# Patient Record
Sex: Female | Born: 1975 | Race: White | Hispanic: No | Marital: Married | Smoking: Former smoker
Health system: Southern US, Community
[De-identification: ages and names within clinical notes are randomized; demographics above are authoritative.]

## PROBLEM LIST (undated history)

## (undated) ENCOUNTER — Inpatient Hospital Stay (HOSPITAL_COMMUNITY): Payer: Self-pay

## (undated) DIAGNOSIS — D649 Anemia, unspecified: Secondary | ICD-10-CM

## (undated) DIAGNOSIS — R51 Headache: Secondary | ICD-10-CM

## (undated) DIAGNOSIS — G43909 Migraine, unspecified, not intractable, without status migrainosus: Secondary | ICD-10-CM

## (undated) DIAGNOSIS — F329 Major depressive disorder, single episode, unspecified: Secondary | ICD-10-CM

## (undated) DIAGNOSIS — J45909 Unspecified asthma, uncomplicated: Secondary | ICD-10-CM

## (undated) DIAGNOSIS — Z8619 Personal history of other infectious and parasitic diseases: Secondary | ICD-10-CM

## (undated) DIAGNOSIS — R519 Headache, unspecified: Secondary | ICD-10-CM

## (undated) DIAGNOSIS — F32A Depression, unspecified: Secondary | ICD-10-CM

## (undated) HISTORY — DX: Depression, unspecified: F32.A

## (undated) HISTORY — DX: Unspecified asthma, uncomplicated: J45.909

## (undated) HISTORY — DX: Anemia, unspecified: D64.9

## (undated) HISTORY — DX: Major depressive disorder, single episode, unspecified: F32.9

## (undated) HISTORY — PX: WISDOM TOOTH EXTRACTION: SHX21

## (undated) HISTORY — DX: Personal history of other infectious and parasitic diseases: Z86.19

---

## 2000-03-11 ENCOUNTER — Other Ambulatory Visit: Admission: RE | Admit: 2000-03-11 | Discharge: 2000-03-11 | Payer: Self-pay | Admitting: Gynecology

## 2003-03-30 ENCOUNTER — Encounter: Payer: Self-pay | Admitting: Emergency Medicine

## 2003-03-30 ENCOUNTER — Emergency Department (HOSPITAL_COMMUNITY): Admission: EM | Admit: 2003-03-30 | Discharge: 2003-03-30 | Payer: Self-pay | Admitting: Emergency Medicine

## 2003-04-01 ENCOUNTER — Inpatient Hospital Stay (HOSPITAL_COMMUNITY): Admission: AD | Admit: 2003-04-01 | Discharge: 2003-04-01 | Payer: Self-pay | Admitting: Obstetrics & Gynecology

## 2004-02-02 ENCOUNTER — Emergency Department (HOSPITAL_COMMUNITY): Admission: EM | Admit: 2004-02-02 | Discharge: 2004-02-02 | Payer: Self-pay | Admitting: *Deleted

## 2004-07-19 ENCOUNTER — Ambulatory Visit (HOSPITAL_COMMUNITY): Admission: RE | Admit: 2004-07-19 | Discharge: 2004-07-19 | Payer: Self-pay | Admitting: Emergency Medicine

## 2005-07-28 ENCOUNTER — Other Ambulatory Visit: Admission: RE | Admit: 2005-07-28 | Discharge: 2005-07-28 | Payer: Self-pay | Admitting: Obstetrics and Gynecology

## 2006-02-12 ENCOUNTER — Inpatient Hospital Stay (HOSPITAL_COMMUNITY): Admission: AD | Admit: 2006-02-12 | Discharge: 2006-02-14 | Payer: Self-pay | Admitting: Obstetrics and Gynecology

## 2010-12-11 ENCOUNTER — Other Ambulatory Visit: Payer: Self-pay | Admitting: Internal Medicine

## 2010-12-11 DIAGNOSIS — N644 Mastodynia: Secondary | ICD-10-CM

## 2010-12-16 ENCOUNTER — Ambulatory Visit
Admission: RE | Admit: 2010-12-16 | Discharge: 2010-12-16 | Disposition: A | Discharge status: Home / Self Care | Payer: BC Managed Care – PPO | Source: Ambulatory Visit | Attending: Internal Medicine | Admitting: Internal Medicine

## 2010-12-16 DIAGNOSIS — N644 Mastodynia: Secondary | ICD-10-CM

## 2013-04-05 ENCOUNTER — Encounter: Payer: Self-pay | Admitting: Obstetrics & Gynecology

## 2013-04-05 ENCOUNTER — Ambulatory Visit (INDEPENDENT_AMBULATORY_CARE_PROVIDER_SITE_OTHER): Payer: BC Managed Care – PPO | Admitting: Obstetrics & Gynecology

## 2013-04-05 VITALS — BP 120/80 | HR 60 | Resp 16 | Ht 65.0 in | Wt 172.8 lb

## 2013-04-05 DIAGNOSIS — R5381 Other malaise: Secondary | ICD-10-CM

## 2013-04-05 DIAGNOSIS — Z124 Encounter for screening for malignant neoplasm of cervix: Secondary | ICD-10-CM

## 2013-04-05 DIAGNOSIS — Z0000 Encounter for general adult medical examination without abnormal findings: Secondary | ICD-10-CM

## 2013-04-05 DIAGNOSIS — Z01419 Encounter for gynecological examination (general) (routine) without abnormal findings: Secondary | ICD-10-CM

## 2013-04-05 LAB — POCT URINALYSIS DIPSTICK
Glucose, UA: NEGATIVE
pH, UA: 5

## 2013-04-05 MED ORDER — DIAPHRAGM ARC-SPRING 65 MM VA DPRH: 1.0000 [IU] | VAGINAL_INSERT | Freq: Once | VAGINAL | Status: DC

## 2013-04-05 NOTE — Patient Instructions (Signed)

## 2013-04-05 NOTE — Progress Notes (Addendum)
37 y.o. G3P2 MarriedCaucasianF here for annual exam.  Friend of Luvenia Redden.  Changing from Ob office.  Cycles regular.  Did have a Mirena IUD for about six years.  Removed June, 2014.  Has had headaches with pills.  "Feels better" without mirena.    Patient's last menstrual period was 03/18/2013.          Sexually active: yes  The current method of family planning is condoms .    Exercising: yes  some walking and aerobics Smoker:  no  Health Maintenance: Pap:  2/13 normal History of abnormal Pap:  no MMG:  2012 for breast pain-normal Colonoscopy:  none BMD:   none TDaP:  Thinks with last pregnancy in 2007 Screening Labs: will start at age 5, Urine today: WBC-trace, RBC-+1   reports that she has never smoked. She has never used smokeless tobacco. She reports that  drinks alcohol. She reports that she does not use illicit drugs.  Past Medical History  Diagnosis Date  . Anemia     during pregnancy    Past Surgical History  Procedure Laterality Date  . Cesarean section      emergency  . Wisdom tooth extraction      No current outpatient prescriptions on file.   No current facility-administered medications for this visit.    Family History  Problem Relation Age of Onset  . Hypertension Mother   . Diabetes Mother   . Hyperlipidemia Mother   . Cancer Maternal Grandmother     lung  . Cancer Maternal Grandfather     unknown type  . Cancer Paternal Grandfather     lung  .       ROS:  Pertinent items are noted in HPI.  Otherwise, a comprehensive ROS was negative.  Exam:   BP 120/80  Pulse 60  Resp 16  Ht 5\' 5"  (1.651 m)  Wt 172 lb 12.8 oz (78.382 kg)  BMI 28.76 kg/m2  LMP 03/18/2013  Height: 5\' 5"  (165.1 cm)  Ht Readings from Last 3 Encounters:  04/05/13 5\' 5"  (1.651 m)    General appearance: alert, cooperative and appears stated age Head: Normocephalic, without obvious abnormality, atraumatic Neck: no adenopathy, supple, symmetrical, trachea midline and  thyroid normal to inspection and palpation Lungs: clear to auscultation bilaterally Breasts: normal appearance, no masses or tenderness Heart: regular rate and rhythm Abdomen: soft, non-tender; bowel sounds normal; no masses,  no organomegaly Extremities: extremities normal, atraumatic, no cyanosis or edema Skin: Skin color, texture, turgor normal. No rashes or lesions Lymph nodes: Cervical, supraclavicular, and axillary nodes normal. No abnormal inguinal nodes palpated Neurologic: Grossly normal   Pelvic: External genitalia:  no lesions              Urethra:  normal appearing urethra with no masses, tenderness or lesions              Bartholins and Skenes: normal                 Vagina: normal appearing vagina with normal color and discharge, no lesions              Cervix: no lesions              Pap taken: yes Bimanual Exam:  Uterus:  normal size, contour, position, consistency, mobility, non-tender              Adnexa: normal adnexa and no mass, fullness, tenderness  Rectovaginal: Confirms               Anus:  normal sphincter tone, no lesions  A:  Well Woman with normal exam Using condoms for [facility].  Discusseed other options--BTL, Essure, Paraguard IUD, diaphragm.  Pt will try diaphragm.  P:   Mammogram starting age 35 pap smear with HR HPV today RX for 65mm diaphram return annually or prn  An After Visit Summary was printed and given to the patient.

## 2013-04-07 LAB — IPS PAP TEST WITH HPV

## 2013-04-08 ENCOUNTER — Telehealth: Payer: Self-pay

## 2013-04-08 NOTE — Telephone Encounter (Signed)
Message copied by Elisha Headland on Fri Apr 08, 2013  4:24 PM ------      Message from: Jerene Bears      Created: Fri Apr 08, 2013  5:33 AM       Inform Pap nl and HPV neg but Pap showed yeast.  If having any symptoms, ok to prescribe Diflucan 150mg  po x 1, repeat 48 hrs.  #2/0RF ------

## 2013-04-08 NOTE — Telephone Encounter (Signed)
Lmtcb//kn 

## 2013-04-19 ENCOUNTER — Other Ambulatory Visit (INDEPENDENT_AMBULATORY_CARE_PROVIDER_SITE_OTHER): Payer: BC Managed Care – PPO

## 2013-04-19 DIAGNOSIS — Z0000 Encounter for general adult medical examination without abnormal findings: Secondary | ICD-10-CM

## 2013-04-19 DIAGNOSIS — R5381 Other malaise: Secondary | ICD-10-CM

## 2013-04-19 DIAGNOSIS — Z01419 Encounter for gynecological examination (general) (routine) without abnormal findings: Secondary | ICD-10-CM

## 2013-04-19 DIAGNOSIS — Z124 Encounter for screening for malignant neoplasm of cervix: Secondary | ICD-10-CM

## 2013-04-19 NOTE — Addendum Note (Signed)
Addended by: Jerene Bears on: 04/19/2013 09:42 PM   Modules accepted: Orders

## 2013-04-20 LAB — COMPLETE METABOLIC PANEL WITH GFR
ALT: 14 U/L (ref 0–35)
AST: 15 U/L (ref 0–37)
Albumin: 4.4 g/dL (ref 3.5–5.2)
Alkaline Phosphatase: 35 U/L — ABNORMAL LOW (ref 39–117)
BUN: 16 mg/dL (ref 6–23)
Calcium: 9.8 mg/dL (ref 8.4–10.5)
Chloride: 101 mEq/L (ref 96–112)
GFR, Est Non African American: >89 mL/min
Glucose, Bld: 90 mg/dL (ref 70–99)
Potassium: 4.7 mEq/L (ref 3.5–5.3)

## 2013-04-20 LAB — CBC
HCT: 45 % (ref 36.0–46.0)
Hemoglobin: 15.1 g/dL — ABNORMAL HIGH (ref 12.0–15.0)
RDW: 13.8 % (ref 11.5–15.5)
WBC: 6.3 10*3/uL (ref 4.0–10.5)

## 2013-04-20 LAB — TSH: TSH: 0.819 u[IU]/mL (ref 0.350–4.500)

## 2013-04-20 NOTE — Addendum Note (Signed)
Addended by: Joeseph Amor on: 04/20/2013 09:57 AM   Modules accepted: Orders

## 2013-04-20 NOTE — Telephone Encounter (Signed)
Advised patient labs are in progress and would be contacted when they are resulted. Patient states she continues to have fatigue, joint ache, sob. Talking in full sentences on the phone. Denies cp and denies fevers. Wondering if she should see pcp, I advised if she feels she needs to see pcp she is welcome to do that. Advised patient to return call if persists or worsens. Patient agreeable.

## 2013-04-20 NOTE — Telephone Encounter (Signed)
Patient is calling because she said she went to the lab yesterday and had blood drawn but the order wasnt in there for what Amber Powell said they were going to draw for so she wanted to know what they drew for and said she doesn't feel well today and didn't know if she needed and appt or if she should go to there pcp.

## 2013-04-23 NOTE — Telephone Encounter (Signed)
Left msg for pt regarding normal labs and i asked her to call with update.

## 2013-04-27 NOTE — Telephone Encounter (Signed)
Can you follow up with pt.  I left msg on Friday that labs were back and normal.  I asked her to page me over the weekend if she needed anything.  Didn't hear anything from her.

## 2013-04-27 NOTE — Telephone Encounter (Signed)
Spoke with patient. She states she feels much improved and really appreciative of you taking the time to reach her over the weekend. She will follow up prn.

## 2014-04-14 ENCOUNTER — Ambulatory Visit: Payer: BC Managed Care – PPO | Admitting: Certified Nurse Midwife

## 2014-04-18 ENCOUNTER — Ambulatory Visit: Payer: BC Managed Care – PPO | Admitting: Certified Nurse Midwife

## 2014-05-15 ENCOUNTER — Encounter: Payer: Self-pay | Admitting: Obstetrics & Gynecology

## 2014-07-14 NOTE — L&D Delivery Note (Signed)
SVD of VFI at 2305 on 04/23/15.  APGARs 8,9.  EBL 350cc.  Placenta to L&D. Head delivered ROA and loose nuchal x 1 reduced.  Body followed atraumatically.  Mouth and nose bulb suctioned.  Cord was clamped, cut and baby to abdomen.  Cord blood was obtained.  Placenta delivered S/I/3VC.  Fundus was firmed with pitocin and massage.  2nd degree perineal lac was repaired with 3-0 Rapide in the normal fashion.  Mom and baby stable.    Mitchel Honour, DO

## 2014-09-19 LAB — OB RESULTS CONSOLE ANTIBODY SCREEN: ANTIBODY SCREEN: NEGATIVE

## 2014-09-19 LAB — OB RESULTS CONSOLE RPR: RPR: NONREACTIVE

## 2014-09-19 LAB — OB RESULTS CONSOLE GC/CHLAMYDIA
CHLAMYDIA, DNA PROBE: NEGATIVE
GC PROBE AMP, GENITAL: NEGATIVE

## 2014-09-19 LAB — OB RESULTS CONSOLE HEPATITIS B SURFACE ANTIGEN: Hepatitis B Surface Ag: NEGATIVE

## 2014-09-19 LAB — OB RESULTS CONSOLE HIV ANTIBODY (ROUTINE TESTING): HIV: NONREACTIVE

## 2014-09-19 LAB — OB RESULTS CONSOLE RUBELLA ANTIBODY, IGM: Rubella: IMMUNE

## 2014-09-19 LAB — OB RESULTS CONSOLE ABO/RH: RH Type: POSITIVE

## 2015-02-19 ENCOUNTER — Encounter (HOSPITAL_COMMUNITY): Payer: Self-pay | Admitting: *Deleted

## 2015-02-19 ENCOUNTER — Inpatient Hospital Stay (HOSPITAL_COMMUNITY)
Admission: AD | Admit: 2015-02-19 | Discharge: 2015-02-20 | Disposition: A | Discharge status: Home / Self Care | Payer: 59 | Source: Ambulatory Visit | Attending: Obstetrics and Gynecology | Admitting: Obstetrics and Gynecology

## 2015-02-19 DIAGNOSIS — R51 Headache: Secondary | ICD-10-CM | POA: Insufficient documentation

## 2015-02-19 DIAGNOSIS — Z3A30 30 weeks gestation of pregnancy: Secondary | ICD-10-CM | POA: Diagnosis not present

## 2015-02-19 DIAGNOSIS — O26893 Other specified pregnancy related conditions, third trimester: Secondary | ICD-10-CM

## 2015-02-19 DIAGNOSIS — R519 Headache, unspecified: Secondary | ICD-10-CM

## 2015-02-19 DIAGNOSIS — Z886 Allergy status to analgesic agent status: Secondary | ICD-10-CM | POA: Diagnosis not present

## 2015-02-19 DIAGNOSIS — O479 False labor, unspecified: Secondary | ICD-10-CM

## 2015-02-19 DIAGNOSIS — O4703 False labor before 37 completed weeks of gestation, third trimester: Secondary | ICD-10-CM | POA: Diagnosis not present

## 2015-02-19 HISTORY — DX: Headache: R51

## 2015-02-19 HISTORY — DX: Headache, unspecified: R51.9

## 2015-02-19 LAB — URINALYSIS, ROUTINE W REFLEX MICROSCOPIC
Bilirubin Urine: NEGATIVE
Glucose, UA: NEGATIVE mg/dL
Ketones, ur: 15 mg/dL — AB
NITRITE: NEGATIVE
Protein, ur: NEGATIVE mg/dL
Urobilinogen, UA: 0.2 mg/dL (ref 0.0–1.0)
pH: 5.5 (ref 5.0–8.0)

## 2015-02-19 LAB — URINE MICROSCOPIC-ADD ON

## 2015-02-19 MED ORDER — PROMETHAZINE HCL 25 MG PO TABS
25.0000 mg | ORAL_TABLET | Freq: Once | ORAL | Status: AC
  Administered 2015-02-19: 25 mg via ORAL
  Filled 2015-02-19: qty 1

## 2015-02-19 MED ORDER — BUTALBITAL-APAP-CAFFEINE 50-325-40 MG PO TABS
2.0000 | ORAL_TABLET | Freq: Once | ORAL | Status: AC
  Administered 2015-02-19: 2 via ORAL
  Filled 2015-02-19: qty 2

## 2015-02-19 NOTE — MAU Provider Note (Signed)
History     CSN: [id number]  Arrival date and time: 02/19/15 2222   First Provider Initiated Contact with Patient 02/19/15 2357      No chief complaint on file.  HPI Comments: Amber Powell is a 39 y.o. G4P2 at [redacted]w[redacted]d who presents today with headache and contractions. She states that she was seen in the office today and was contracting on the monitor. She was given procardia and states that when she took it the first time she "felt bad". She felt dizzy and then developed the headache after taking it. She states that she felt much worse after the second dose. She states that she has a history of migraines. She states that she took one tylenol PTA, and states that it did not help her headache. She rates her current pain 10/10. She also feels that the procardia has not helped with the contractions. She states that she had some spotting this morning, and she was closed. She denies any LOF. She states that the fetus has been moving normally.    Headache  This is a new problem. The current episode started today. The problem occurs constantly. The problem has been unchanged. The pain is located in the bilateral region. The pain does not radiate. The pain quality is similar to prior headaches. The quality of the pain is described as throbbing. The pain is at a severity of 10/10. Associated symptoms include abdominal pain, nausea and vomiting. Pertinent negatives include no fever. The symptoms are aggravated by medications and bright light. She has tried acetaminophen for the symptoms. The treatment provided no relief.    Past Medical History  Diagnosis Date  . Anemia     during pregnancy  . Headache     Past Surgical History  Procedure Laterality Date  . Cesarean section      emergency  . Wisdom tooth extraction      Family History  Problem Relation Age of Onset  . Hypertension Mother   . Diabetes Mother   . Hyperlipidemia Mother   . Cancer Maternal Grandmother     lung  . Cancer  Maternal Grandfather     unknown type  . Cancer Paternal Grandfather     lung  . Other Child     trisomy 17, deceased    History  Substance Use Topics  . Smoking status: Never Smoker   . Smokeless tobacco: Never Used  . Alcohol Use: Yes     Comment: very little, not while preg    Allergies: No Known Allergies  Prescriptions prior to admission  Medication Sig Dispense Refill Last Dose  . acetaminophen (TYLENOL) 325 MG tablet Take 650 mg by mouth every 6 (six) hours as needed.   02/18/2015 at 1900  . Diaphragm Arc-Spring (ORTHO DIAPHRAGM ALL-FLEX) 65 MM DPRH Place 1 Units vaginally once. 1 each 0 More than a month at Unknown time    Review of Systems  Constitutional: Negative for fever.  Gastrointestinal: Positive for nausea, vomiting and abdominal pain. Negative for diarrhea and constipation.  Genitourinary: Negative for dysuria, urgency and frequency.  Neurological: Positive for headaches.   Physical Exam   Blood pressure 121/72, pulse 75, temperature 98.3 F (36.8 C), temperature source Oral, resp. rate 20, height  (1.676 m), weight 89.812 kg (198 lb), SpO2 100 %.  Physical Exam  Nursing note and vitals reviewed. Constitutional: She is oriented to person, place, and time. She appears well-developed and well-nourished. No distress.  HENT:  Head: Normocephalic.  Cardiovascular:  Normal rate.   Respiratory: Effort normal.  GI: Soft. There is no tenderness.  Genitourinary:   Cervix: closed/thick/high/somewhat soft.   Neurological: She is alert and oriented to person, place, and time.  Skin: Skin is warm and dry.  Psychiatric: She has a normal mood and affect.   FHT: 135, moderate with 15x15 accels, no decels Toco: some irregular contractions.   Results for orders placed or performed during the hospital encounter of 02/19/15 (from the past 24 hour(s))  Urinalysis, Routine w reflex microscopic (not at [facility])     Status: Abnormal   Collection Time: 02/19/15 10:45 PM   Result Value Ref Range   Color, Urine YELLOW YELLOW   APPearance CLEAR CLEAR   Specific Gravity, Urine >1.030 (H) 1.005 - 1.030   pH 5.5 5.0 - 8.0   Glucose, UA NEGATIVE NEGATIVE mg/dL   Hgb urine dipstick MODERATE (A) NEGATIVE   Bilirubin Urine NEGATIVE NEGATIVE   Ketones, ur 15 (A) NEGATIVE mg/dL   Protein, ur NEGATIVE NEGATIVE mg/dL   Urobilinogen, UA 0.2 0.0 - 1.0 mg/dL   Nitrite NEGATIVE NEGATIVE   Leukocytes, UA LARGE (A) NEGATIVE  Urine microscopic-add on     Status: Abnormal   Collection Time: 02/19/15 10:45 PM  Result Value Ref Range   Squamous Epithelial / LPF FEW (A) RARE   WBC, UA 21-50 <3 WBC/hpf   RBC / HPF 0-2 <3 RBC/hpf   Bacteria, UA MANY (A) RARE   Urine-Other MUCOUS PRESENT     MAU Course  Procedures  MDM 4098: Patient reports that her headache is down to a 5/10 from a 10/10 after fiorcet and phenergan. She states that she is feeling much better.  1191: D/W Dr. Vincente Poli, ok for dc home.    Assessment and Plan   1. Headache in pregnancy, third trimester   2. Braxton Hicks contractions    DC home Comfort measures reviewed  3rd Trimester precautions  PTL precautions  Fetal kick counts RX: none  Return to MAU as needed FU with OB as planned  Follow-up Information    Follow up with Zelphia Cairo, MD.   Specialty:  Obstetrics and Gynecology   Why:  As scheduled   Contact information:   [address] [place], [address] [place] [address] [phone]         Tawnya Crook 02/19/2015, 11:58 PM

## 2015-02-19 NOTE — MAU Note (Signed)
Pt was seen in MD office this am, was having some spotting and contractions. SVE 0. Took procardia at 1330 and 1930, has had a headache for the last 2 hours and contractions have continued. Vomited x 1.

## 2015-02-20 DIAGNOSIS — O26893 Other specified pregnancy related conditions, third trimester: Secondary | ICD-10-CM | POA: Diagnosis not present

## 2015-02-20 DIAGNOSIS — R51 Headache: Secondary | ICD-10-CM

## 2015-02-20 DIAGNOSIS — O4703 False labor before 37 completed weeks of gestation, third trimester: Secondary | ICD-10-CM

## 2015-02-20 DIAGNOSIS — Z3A30 30 weeks gestation of pregnancy: Secondary | ICD-10-CM

## 2015-02-20 NOTE — Discharge Instructions (Signed)

## 2015-03-29 ENCOUNTER — Encounter: Payer: Self-pay | Admitting: Internal Medicine

## 2015-04-02 LAB — OB RESULTS CONSOLE GBS: STREP GROUP B AG: POSITIVE

## 2015-04-03 ENCOUNTER — Inpatient Hospital Stay (HOSPITAL_COMMUNITY)
Admission: AD | Admit: 2015-04-03 | Discharge: 2015-04-03 | Disposition: A | Discharge status: Home / Self Care | Payer: 59 | Source: Ambulatory Visit | Attending: Obstetrics and Gynecology | Admitting: Obstetrics and Gynecology

## 2015-04-03 ENCOUNTER — Encounter (HOSPITAL_COMMUNITY): Payer: Self-pay | Admitting: *Deleted

## 2015-04-03 DIAGNOSIS — O26893 Other specified pregnancy related conditions, third trimester: Secondary | ICD-10-CM | POA: Insufficient documentation

## 2015-04-03 DIAGNOSIS — R030 Elevated blood-pressure reading, without diagnosis of hypertension: Secondary | ICD-10-CM | POA: Insufficient documentation

## 2015-04-03 DIAGNOSIS — R7989 Other specified abnormal findings of blood chemistry: Secondary | ICD-10-CM | POA: Diagnosis not present

## 2015-04-03 DIAGNOSIS — R945 Abnormal results of liver function studies: Secondary | ICD-10-CM

## 2015-04-03 DIAGNOSIS — Z3A36 36 weeks gestation of pregnancy: Secondary | ICD-10-CM | POA: Insufficient documentation

## 2015-04-03 LAB — CBC
HCT: 35.9 % — ABNORMAL LOW (ref 36.0–46.0)
Hemoglobin: 11.9 g/dL — ABNORMAL LOW (ref 12.0–15.0)
MCH: 28.3 pg (ref 26.0–34.0)
MCHC: 33.1 g/dL (ref 30.0–36.0)
MCV: 85.3 fL (ref 78.0–100.0)
PLATELETS: 230 10*3/uL (ref 150–400)
RBC: 4.21 MIL/uL (ref 3.87–5.11)
RDW: 14.3 % (ref 11.5–15.5)
WBC: 8.7 10*3/uL (ref 4.0–10.5)

## 2015-04-03 LAB — COMPREHENSIVE METABOLIC PANEL
ALT: 29 U/L (ref 14–54)
ANION GAP: 8 (ref 5–15)
AST: 20 U/L (ref 15–41)
Albumin: 2.7 g/dL — ABNORMAL LOW (ref 3.5–5.0)
Alkaline Phosphatase: 95 U/L (ref 38–126)
BUN: 13 mg/dL (ref 6–20)
CO2: 21 mmol/L — AB (ref 22–32)
Calcium: 8.4 mg/dL — ABNORMAL LOW (ref 8.9–10.3)
Chloride: 105 mmol/L (ref 101–111)
Creatinine, Ser: 0.42 mg/dL — ABNORMAL LOW (ref 0.44–1.00)
GFR calc non Af Amer: >60 mL/min (ref >60)
Glucose, Bld: 95 mg/dL (ref 65–99)
POTASSIUM: 3.9 mmol/L (ref 3.5–5.1)
Sodium: 134 mmol/L — ABNORMAL LOW (ref 135–145)
Total Bilirubin: 0.6 mg/dL (ref 0.3–1.2)
Total Protein: 6.1 g/dL — ABNORMAL LOW (ref 6.5–8.1)

## 2015-04-03 LAB — URINE MICROSCOPIC-ADD ON

## 2015-04-03 LAB — URINALYSIS, ROUTINE W REFLEX MICROSCOPIC
BILIRUBIN URINE: NEGATIVE
Glucose, UA: NEGATIVE mg/dL
Ketones, ur: 15 mg/dL — AB
NITRITE: NEGATIVE
Protein, ur: NEGATIVE mg/dL
Urobilinogen, UA: 0.2 mg/dL (ref 0.0–1.0)
pH: 6 (ref 5.0–8.0)

## 2015-04-03 LAB — PROTEIN / CREATININE RATIO, URINE
Creatinine, Urine: 127 mg/dL
Protein Creatinine Ratio: 0.09 mg/mg{Cre} (ref 0.00–0.15)
Total Protein, Urine: 12 mg/dL

## 2015-04-03 LAB — URIC ACID: URIC ACID, SERUM: 4 mg/dL (ref 2.3–6.6)

## 2015-04-03 LAB — LACTATE DEHYDROGENASE: LDH: 127 U/L (ref 98–192)

## 2015-04-03 NOTE — MAU Note (Signed)
Sent from office for further evaluation of HTN.

## 2015-04-03 NOTE — Discharge Instructions (Signed)
Hypertension During Pregnancy Hypertension, or high blood pressure, is when there is extra pressure inside your blood vessels that carry blood from the heart to the rest of your body (arteries). It can happen at any time in life, including pregnancy. Hypertension during pregnancy can cause problems for you and your baby. Your baby might not weigh as much as he or she should at birth or might be born early (premature). Very bad cases of hypertension during pregnancy can be life-threatening.  Different types of hypertension can occur during pregnancy. These include:  Chronic hypertension. This happens when a woman has hypertension before pregnancy and it continues during pregnancy.  Gestational hypertension. This is when hypertension develops during pregnancy.  Preeclampsia or toxemia of pregnancy. This is a very serious type of hypertension that develops only during pregnancy. It affects the whole body and can be very dangerous for both mother and baby.  Gestational hypertension and preeclampsia usually go away after your baby is born. Your blood pressure will likely stabilize within 6 weeks. Women who have hypertension during pregnancy have a greater chance of developing hypertension later in life or with future pregnancies. RISK FACTORS There are certain factors that make it more likely for you to develop hypertension during pregnancy. These include:  Having hypertension before pregnancy.  Having hypertension during a previous pregnancy.  Being overweight.  Being older than 40 years.  Being pregnant with more than one baby.  Having diabetes or kidney problems. SIGNS AND SYMPTOMS Chronic and gestational hypertension rarely cause symptoms. Preeclampsia has symptoms, which may include:  Increased protein in your urine. Your health care provider will check for this at every prenatal visit.  Swelling of your hands and face.  Rapid weight gain.  Headaches.  Visual changes.  Being  bothered by light.  Abdominal pain, especially in the upper right area.  Chest pain.  Shortness of breath.  Increased reflexes.  Seizures. These occur with a more severe form of preeclampsia, called eclampsia. DIAGNOSIS  You may be diagnosed with hypertension during a regular prenatal exam. At each prenatal visit, you may have:  Your blood pressure checked.  A urine test to check for protein in your urine. The type of hypertension you are diagnosed with depends on when you developed it. It also depends on your specific blood pressure reading.  Developing hypertension before 20 weeks of pregnancy is consistent with chronic hypertension.  Developing hypertension after 20 weeks of pregnancy is consistent with gestational hypertension.  Hypertension with increased urinary protein is diagnosed as preeclampsia.  Blood pressure measurements that stay above 160 systolic or 110 diastolic are a sign of severe preeclampsia. TREATMENT Treatment for hypertension during pregnancy varies. Treatment depends on the type of hypertension and how serious it is.  If you take medicine for chronic hypertension, you may need to switch medicines.  Medicines called ACE inhibitors should not be taken during pregnancy.  Low-dose aspirin may be suggested for women who have risk factors for preeclampsia.  If you have gestational hypertension, you may need to take a blood pressure medicine that is safe during pregnancy. Your health care provider will recommend the correct medicine.  If you have severe preeclampsia, you may need to be in the hospital. Health care providers will watch you and your baby very closely. You also may need to take medicine called magnesium sulfate to prevent seizures and lower blood pressure.  Sometimes, an early delivery is needed. This may be the case if the condition worsens. It would be   done to protect you and your baby. The only cure for preeclampsia is delivery.  Your health  care provider may recommend that you take one low-dose aspirin (81 mg) each day to help prevent high blood pressure during your pregnancy if you are at risk for preeclampsia. You may be at risk for preeclampsia if:  You had preeclampsia or eclampsia during a previous pregnancy.  Your baby did not grow as expected during a previous pregnancy.  You experienced preterm birth with a previous pregnancy.  You experienced a separation of the placenta from the uterus (placental abruption) during a previous pregnancy.  You experienced the loss of your baby during a previous pregnancy.  You are pregnant with more than one baby.  You have other medical conditions, such as diabetes or an autoimmune disease. HOME CARE INSTRUCTIONS  Schedule and keep all of your regular prenatal care appointments. This is important.  Take medicines only as directed by your health care provider. Tell your health care provider about all medicines you take.  Eat as little salt as possible.  Get regular exercise.  Do not drink alcohol.  Do not use tobacco products.  Do not drink products with caffeine.  Lie on your left side when resting. SEEK IMMEDIATE MEDICAL CARE IF:  You have severe abdominal pain.  You have sudden swelling in your hands, ankles, or face.  You gain 4 pounds (1.8 kg) or more in 1 week.  You vomit repeatedly.  You have vaginal bleeding.  You do not feel your baby moving as much.  You have a headache.  You have blurred or double vision.  You have muscle twitching or spasms.  You have shortness of breath.  You have blue fingernails or lips.  You have blood in your urine. MAKE SURE YOU:  Understand these instructions.  Will watch your condition.  Will get help right away if you are not doing well or get worse. Document Released: 03/18/2011 Document Revised: 11/14/2013 Document Reviewed: 01/27/2013 [employer] Patient Information 2015 [employer]. This information is not  intended to replace advice given to you by your health care provider. Make sure you discuss any questions you have with your health care provider.   Fetal Movement Counts Patient Name: __________________________________________________ Patient Due Date: ____________________ Performing a fetal movement count is highly recommended in high-risk pregnancies, but it is good for every pregnant woman to do. Your health care provider may ask you to start counting fetal movements at 28 weeks of the pregnancy. Fetal movements often increase:  After eating a full meal.  After physical activity.  After eating or drinking something sweet or cold.  At rest. Pay attention to when you feel the baby is most active. This will help you notice a pattern of your baby's sleep and wake cycles and what factors contribute to an increase in fetal movement. It is important to perform a fetal movement count at the same time each day when your baby is normally most active.  HOW TO COUNT FETAL MOVEMENTS  Find a quiet and comfortable area to sit or lie down on your left side. Lying on your left side provides the best blood and oxygen circulation to your baby.  Write down the day and time on a sheet of paper or in a journal.  Start counting kicks, flutters, swishes, rolls, or jabs in a 2-hour period. You should feel at least 10 movements within 2 hours.  If you do not feel 10 movements in 2 hours, wait 2-3   hours and count again. Look for a change in the pattern or not enough counts in 2 hours. SEEK MEDICAL CARE IF:  You feel less than 10 counts in 2 hours, tried twice.  There is no movement in over an hour.  The pattern is changing or taking longer each day to reach 10 counts in 2 hours.  You feel the baby is not moving as he or she usually does. Date: ____________ Movements: ____________ Start time: ____________ Finish time: ____________  Date: ____________ Movements: ____________ Start time: ____________ Finish  time: ____________ Date: ____________ Movements: ____________ Start time: ____________ Finish time: ____________ Date: ____________ Movements: ____________ Start time: ____________ Finish time: ____________ Date: ____________ Movements: ____________ Start time: ____________ Finish time: ____________ Date: ____________ Movements: ____________ Start time: ____________ Finish time: ____________ Date: ____________ Movements: ____________ Start time: ____________ Finish time: ____________ Date: ____________ Movements: ____________ Start time: ____________ Finish time: ____________  Date: ____________ Movements: ____________ Start time: ____________ Finish time: ____________ Date: ____________ Movements: ____________ Start time: ____________ Finish time: ____________ Date: ____________ Movements: ____________ Start time: ____________ Finish time: ____________ Date: ____________ Movements: ____________ Start time: ____________ Finish time: ____________ Date: ____________ Movements: ____________ Start time: ____________ Finish time: ____________ Date: ____________ Movements: ____________ Start time: ____________ Finish time: ____________ Date: ____________ Movements: ____________ Start time: ____________ Finish time: ____________  Date: ____________ Movements: ____________ Start time: ____________ Finish time: ____________ Date: ____________ Movements: ____________ Start time: ____________ Finish time: ____________ Date: ____________ Movements: ____________ Start time: ____________ Finish time: ____________ Date: ____________ Movements: ____________ Start time: ____________ Finish time: ____________ Date: ____________ Movements: ____________ Start time: ____________ Finish time: ____________ Date: ____________ Movements: ____________ Start time: ____________ Finish time: ____________ Date: ____________ Movements: ____________ Start time: ____________ Finish time: ____________  Date: ____________  Movements: ____________ Start time: ____________ Finish time: ____________ Date: ____________ Movements: ____________ Start time: ____________ Finish time: ____________ Date: ____________ Movements: ____________ Start time: ____________ Finish time: ____________ Date: ____________ Movements: ____________ Start time: ____________ Finish time: ____________ Date: ____________ Movements: ____________ Start time: ____________ Finish time: ____________ Date: ____________ Movements: ____________ Start time: ____________ Finish time: ____________ Date: ____________ Movements: ____________ Start time: ____________ Finish time: ____________  Date: ____________ Movements: ____________ Start time: ____________ Finish time: ____________ Date: ____________ Movements: ____________ Start time: ____________ Finish time: ____________ Date: ____________ Movements: ____________ Start time: ____________ Finish time: ____________ Date: ____________ Movements: ____________ Start time: ____________ Finish time: ____________ Date: ____________ Movements: ____________ Start time: ____________ Finish time: ____________ Date: ____________ Movements: ____________ Start time: ____________ Finish time: ____________ Date: ____________ Movements: ____________ Start time: ____________ Finish time: ____________  Date: ____________ Movements: ____________ Start time: ____________ Finish time: ____________ Date: ____________ Movements: ____________ Start time: ____________ Finish time: ____________ Date: ____________ Movements: ____________ Start time: ____________ Finish time: ____________ Date: ____________ Movements: ____________ Start time: ____________ Finish time: ____________ Date: ____________ Movements: ____________ Start time: ____________ Finish time: ____________ Date: ____________ Movements: ____________ Start time: ____________ Finish time: ____________ Date: ____________ Movements: ____________ Start time:  ____________ Finish time: ____________  Date: ____________ Movements: ____________ Start time: ____________ Finish time: ____________ Date: ____________ Movements: ____________ Start time: ____________ Finish time: ____________ Date: ____________ Movements: ____________ Start time: ____________ Finish time: ____________ Date: ____________ Movements: ____________ Start time: ____________ Finish time: ____________ Date: ____________ Movements: ____________ Start time: ____________ Finish time: ____________ Date: ____________ Movements: ____________ Start time: ____________ Finish time: ____________ Date: ____________ Movements: ____________ Start time: ____________ Finish time: ____________  Date: ____________ Movements: ____________ Start time: ____________ Finish time: ____________ Date: ____________ Movements: ____________ Start   time: ____________ Finish time: ____________ Date: ____________ Movements: ____________ Start time: ____________ Finish time: ____________ Date: ____________ Movements: ____________ Start time: ____________ Finish time: ____________ Date: ____________ Movements: ____________ Start time: ____________ Finish time: ____________ Date: ____________ Movements: ____________ Start time: ____________ Finish time: ____________ Document Released: 07/30/2006 Document Revised: 11/14/2013 Document Reviewed: 04/26/2012 [employer] Patient Information 2015 [employer]. This information is not intended to replace advice given to you by your health care provider. Make sure you discuss any questions you have with your health care provider.  

## 2015-04-03 NOTE — MAU Provider Note (Signed)
History     CSN: [id number]  Arrival date and time: 04/03/15 1401   First Provider Initiated Contact with Patient 04/03/15 1458      Chief Complaint  Patient presents with  . Hypertension   HPI  Amber Powell is a 39 y.o. G4P2 at [redacted]w[redacted]d who was sent from office for blood pressure evaluation. States was seen in office yesterday & BP was slightly elevated; labwork done in office. Was called today & told that her liver enzymes were elevated & that she needed to come to [facility]U for further evaluation.  States increased BPs with first pregnancy. Denies headache or epigastric pain.  Reports some spots in vision while driving to the hospital this afternoon.  Denies vaginal bleeding, contractions, or LOF.  Positive fetal movement.   OB History    Gravida Para Term Preterm AB TAB SAB Ectopic Multiple Living   Past Medical History  Diagnosis Date  . Anemia     during pregnancy  . Headache     Past Surgical History  Procedure Laterality Date  . Cesarean section      emergency  . Wisdom tooth extraction      Family History  Problem Relation Age of Onset  . Hypertension Mother   . Diabetes Mother   . Hyperlipidemia Mother   . Cancer Maternal Grandmother     lung  . Cancer Maternal Grandfather     unknown type  . Cancer Paternal Grandfather     lung  . Other Child     trisomy 41, deceased    Social History  Substance Use Topics  . Smoking status: Never Smoker   . Smokeless tobacco: Never Used  . Alcohol Use: Yes     Comment: very little, not while preg    Allergies: No Known Allergies  Prescriptions prior to admission  Medication Sig Dispense Refill Last Dose  . Prenatal Vit-Fe Fumarate-FA (PRENATAL MULTIVITAMIN) TABS tablet Take 1 tablet by mouth daily at 12 noon.   04/02/2015 at Unknown time    Review of Systems  Constitutional: Negative.   Eyes: Positive for blurred vision.  Respiratory: Negative for shortness of breath.   Cardiovascular:  Negative for chest pain and palpitations.  Gastrointestinal: Negative for nausea, vomiting and abdominal pain.  Genitourinary: Negative for dysuria.       No vaginal bleeding or LOF  Neurological: Negative for sensory change, seizures and headaches.   I have reviewed the past Medical Hx, Surgical Hx, Social Hx, Allergies and Medications.   Physical Exam   Blood pressure 124/79, pulse 90.  Patient Vitals for the past 24 hrs:  BP Pulse  04/03/15 1500 124/79 mmHg 90  04/03/15 1450 126/75 mmHg 82  04/03/15 1442 117/78 mmHg 83     Physical Exam  Nursing note and vitals reviewed. Constitutional: She is oriented to person, place, and time. She appears well-developed and well-nourished. No distress.  HENT:  Head: Normocephalic and atraumatic.  Eyes: Conjunctivae are normal. Right eye exhibits no discharge. Left eye exhibits no discharge. No scleral icterus.  Neck: Normal range of motion.  Cardiovascular: Normal rate, regular rhythm and normal heart sounds.   No murmur heard. Respiratory: Effort normal and breath sounds normal. No respiratory distress. She has no wheezes.  GI: Soft. There is no tenderness.  Musculoskeletal: She exhibits edema (bilateral hands & ankles. Mild pitting).  Neurological: She is alert and oriented to person, place,  and time. She displays normal reflexes.  No clonus  Skin: Skin is warm and dry. She is not diaphoretic.  Psychiatric: She has a normal mood and affect. Her behavior is normal. Judgment and thought content normal.   Fetal Tracing:  Baseline: 125 Variability: moderate Accelerations: 15x15 Decelerations: none  Toco: every 2-5 mins, palpate mild, pt doesn't feel   MAU Course  Procedures Results for orders placed or performed during the hospital encounter of 04/03/15 (from the past 24 hour(s))  CBC     Status: Abnormal   Collection Time: 04/03/15  2:36 PM  Result Value Ref Range   WBC 8.7 4.0 - 10.5 K/uL   RBC 4.21 3.87 - 5.11 MIL/uL    Hemoglobin 11.9 (L) 12.0 - 15.0 g/dL   HCT 16.1 (L) 09.6 - 04.5 %   MCV 85.3 78.0 - 100.0 fL   MCH 28.3 26.0 - 34.0 pg   MCHC 33.1 30.0 - 36.0 g/dL   RDW 40.9 81.1 - 91.4 %   Platelets 230 150 - 400 K/uL    MDM PIH labs Category 1 tracing. Contractions on monitor; pt denies feeling them.  1545- C/w Dr. Arelia Sneddon; discussed labs, BPs, & FHT. Discharge home; f/u later this week to evaluate LFTs.   Assessment and Plan  A: 1. Elevated liver function tests    P: Discharge home Call office to scheduled bloodwork for LFTs later this week.  Discussed reasons to return to MAU r/t labor, fetal movement, & hypertension  Judeth Horn, NP  04/03/2015, 2:58 PM

## 2015-04-05 LAB — CULTURE, OB URINE

## 2015-04-20 ENCOUNTER — Encounter (HOSPITAL_COMMUNITY): Payer: Self-pay | Admitting: *Deleted

## 2015-04-20 ENCOUNTER — Telehealth (HOSPITAL_COMMUNITY): Payer: Self-pay | Admitting: *Deleted

## 2015-04-20 NOTE — Telephone Encounter (Signed)
Preadmission screen  

## 2015-04-23 ENCOUNTER — Inpatient Hospital Stay (HOSPITAL_COMMUNITY): Payer: 59 | Admitting: Anesthesiology

## 2015-04-23 ENCOUNTER — Encounter (HOSPITAL_COMMUNITY): Payer: Self-pay

## 2015-04-23 ENCOUNTER — Inpatient Hospital Stay (HOSPITAL_COMMUNITY)
Admission: RE | Admit: 2015-04-23 | Discharge: 2015-04-25 | DRG: 775 | Disposition: A | Discharge status: Home / Self Care | Payer: 59 | Source: Ambulatory Visit | Attending: Obstetrics & Gynecology | Admitting: Obstetrics & Gynecology

## 2015-04-23 DIAGNOSIS — Z3490 Encounter for supervision of normal pregnancy, unspecified, unspecified trimester: Secondary | ICD-10-CM

## 2015-04-23 DIAGNOSIS — O99214 Obesity complicating childbirth: Secondary | ICD-10-CM | POA: Diagnosis present

## 2015-04-23 DIAGNOSIS — K219 Gastro-esophageal reflux disease without esophagitis: Secondary | ICD-10-CM | POA: Diagnosis present

## 2015-04-23 DIAGNOSIS — E669 Obesity, unspecified: Secondary | ICD-10-CM | POA: Diagnosis present

## 2015-04-23 DIAGNOSIS — O99344 Other mental disorders complicating childbirth: Secondary | ICD-10-CM | POA: Diagnosis present

## 2015-04-23 DIAGNOSIS — O9962 Diseases of the digestive system complicating childbirth: Secondary | ICD-10-CM | POA: Diagnosis present

## 2015-04-23 DIAGNOSIS — Z809 Family history of malignant neoplasm, unspecified: Secondary | ICD-10-CM | POA: Diagnosis not present

## 2015-04-23 DIAGNOSIS — O9952 Diseases of the respiratory system complicating childbirth: Secondary | ICD-10-CM | POA: Diagnosis present

## 2015-04-23 DIAGNOSIS — F329 Major depressive disorder, single episode, unspecified: Secondary | ICD-10-CM | POA: Diagnosis present

## 2015-04-23 DIAGNOSIS — Z87891 Personal history of nicotine dependence: Secondary | ICD-10-CM

## 2015-04-23 DIAGNOSIS — Z6833 Body mass index (BMI) 33.0-33.9, adult: Secondary | ICD-10-CM

## 2015-04-23 DIAGNOSIS — Z833 Family history of diabetes mellitus: Secondary | ICD-10-CM

## 2015-04-23 DIAGNOSIS — J45909 Unspecified asthma, uncomplicated: Secondary | ICD-10-CM | POA: Diagnosis present

## 2015-04-23 DIAGNOSIS — Z8249 Family history of ischemic heart disease and other diseases of the circulatory system: Secondary | ICD-10-CM | POA: Diagnosis not present

## 2015-04-23 DIAGNOSIS — O99824 Streptococcus B carrier state complicating childbirth: Secondary | ICD-10-CM | POA: Diagnosis present

## 2015-04-23 DIAGNOSIS — Z3A39 39 weeks gestation of pregnancy: Secondary | ICD-10-CM | POA: Diagnosis not present

## 2015-04-23 DIAGNOSIS — O09529 Supervision of elderly multigravida, unspecified trimester: Secondary | ICD-10-CM | POA: Diagnosis not present

## 2015-04-23 DIAGNOSIS — O9902 Anemia complicating childbirth: Secondary | ICD-10-CM | POA: Diagnosis present

## 2015-04-23 LAB — TYPE AND SCREEN
ABO/RH(D): A POS
ANTIBODY SCREEN: NEGATIVE

## 2015-04-23 LAB — RPR: RPR Ser Ql: NONREACTIVE

## 2015-04-23 LAB — CBC
HEMATOCRIT: 37.5 % (ref 36.0–46.0)
HEMOGLOBIN: 12.5 g/dL (ref 12.0–15.0)
MCH: 28.2 pg (ref 26.0–34.0)
MCHC: 33.3 g/dL (ref 30.0–36.0)
MCV: 84.5 fL (ref 78.0–100.0)
Platelets: 239 10*3/uL (ref 150–400)
RBC: 4.44 MIL/uL (ref 3.87–5.11)
RDW: 14.3 % (ref 11.5–15.5)
WBC: 8.4 10*3/uL (ref 4.0–10.5)

## 2015-04-23 LAB — ABO/RH: ABO/RH(D): A POS

## 2015-04-23 MED ORDER — FLEET ENEMA 7-19 GM/118ML RE ENEM: 1.0000 | ENEMA | RECTAL | Status: DC | PRN

## 2015-04-23 MED ORDER — BUTORPHANOL TARTRATE 1 MG/ML IJ SOLN: 1.0000 mg | INTRAMUSCULAR | Status: DC | PRN

## 2015-04-23 MED ORDER — ONDANSETRON HCL 4 MG/2ML IJ SOLN: 4.0000 mg | Freq: Four times a day (QID) | INTRAMUSCULAR | Status: DC | PRN

## 2015-04-23 MED ORDER — LACTATED RINGERS IV SOLN: 500.0000 mL | INTRAVENOUS | Status: DC | PRN

## 2015-04-23 MED ORDER — PENICILLIN G POTASSIUM 5000000 UNITS IJ SOLR
5.0000 10*6.[IU] | Freq: Once | INTRAVENOUS | Status: AC
  Administered 2015-04-23: 5 10*6.[IU] via INTRAVENOUS
  Filled 2015-04-23: qty 5

## 2015-04-23 MED ORDER — LIDOCAINE HCL (PF) 1 % IJ SOLN
INTRAMUSCULAR | Status: DC | PRN
  Administered 2015-04-23 (×2): 4 mL via EPIDURAL

## 2015-04-23 MED ORDER — PNEUMOCOCCAL VAC POLYVALENT 25 MCG/0.5ML IJ INJ
0.5000 mL | INJECTION | INTRAMUSCULAR | Status: DC
  Filled 2015-04-23: qty 0.5

## 2015-04-23 MED ORDER — OXYTOCIN BOLUS FROM INFUSION: 500.0000 mL | INTRAVENOUS | Status: DC

## 2015-04-23 MED ORDER — OXYCODONE-ACETAMINOPHEN 5-325 MG PO TABS: 1.0000 | ORAL_TABLET | ORAL | Status: DC | PRN

## 2015-04-23 MED ORDER — ACETAMINOPHEN 325 MG PO TABS: 650.0000 mg | ORAL_TABLET | ORAL | Status: DC | PRN

## 2015-04-23 MED ORDER — TERBUTALINE SULFATE 1 MG/ML IJ SOLN: 0.2500 mg | Freq: Once | INTRAMUSCULAR | Status: DC | PRN

## 2015-04-23 MED ORDER — OXYTOCIN 40 UNITS IN LACTATED RINGERS INFUSION - SIMPLE MED
62.5000 mL/h | INTRAVENOUS | Status: DC
Start: 2015-04-23 — End: 2015-04-24

## 2015-04-23 MED ORDER — CITRIC ACID-SODIUM CITRATE 334-500 MG/5ML PO SOLN: 30.0000 mL | ORAL | Status: DC | PRN

## 2015-04-23 MED ORDER — OXYCODONE-ACETAMINOPHEN 5-325 MG PO TABS: 2.0000 | ORAL_TABLET | ORAL | Status: DC | PRN

## 2015-04-23 MED ORDER — LACTATED RINGERS IV SOLN
INTRAVENOUS | Status: DC
  Administered 2015-04-23 (×4): via INTRAVENOUS

## 2015-04-23 MED ORDER — EPHEDRINE 5 MG/ML INJ: 10.0000 mg | INTRAVENOUS | Status: DC | PRN

## 2015-04-23 MED ORDER — ZOLPIDEM TARTRATE 5 MG PO TABS: 5.0000 mg | ORAL_TABLET | Freq: Every evening | ORAL | Status: DC | PRN

## 2015-04-23 MED ORDER — PENICILLIN G POTASSIUM 5000000 UNITS IJ SOLR
2.5000 10*6.[IU] | INTRAVENOUS | Status: DC
  Administered 2015-04-23 (×5): 2.5 10*6.[IU] via INTRAVENOUS
  Filled 2015-04-23 (×10): qty 2.5

## 2015-04-23 MED ORDER — PHENYLEPHRINE 40 MCG/ML (10ML) SYRINGE FOR IV PUSH (FOR BLOOD PRESSURE SUPPORT)
80.0000 ug | PREFILLED_SYRINGE | INTRAVENOUS | Status: DC | PRN
  Filled 2015-04-23: qty 20

## 2015-04-23 MED ORDER — OXYTOCIN 40 UNITS IN LACTATED RINGERS INFUSION - SIMPLE MED
1.0000 m[IU]/min | INTRAVENOUS | Status: DC
  Administered 2015-04-23: 1 m[IU]/min via INTRAVENOUS
  Filled 2015-04-23: qty 1000

## 2015-04-23 MED ORDER — DIPHENHYDRAMINE HCL 50 MG/ML IJ SOLN: 12.5000 mg | INTRAMUSCULAR | Status: DC | PRN

## 2015-04-23 MED ORDER — LIDOCAINE HCL (PF) 1 % IJ SOLN
30.0000 mL | INTRAMUSCULAR | Status: DC | PRN
  Filled 2015-04-23: qty 30

## 2015-04-23 MED ORDER — FENTANYL 2.5 MCG/ML BUPIVACAINE 1/10 % EPIDURAL INFUSION (WH - ANES)
14.0000 mL/h | INTRAMUSCULAR | Status: DC | PRN
  Administered 2015-04-23 (×2): 14 mL/h via EPIDURAL
  Filled 2015-04-23 (×2): qty 125

## 2015-04-23 NOTE — Anesthesia Procedure Notes (Signed)
Epidural Patient location during procedure: OB Start time: 04/23/2015 9:31 AM  Staffing Anesthesiologist: Mal Amabile Performed by: anesthesiologist   Preanesthetic Checklist Completed: patient identified, site marked, surgical consent, pre-op evaluation, timeout performed, IV checked, risks and benefits discussed and monitors and equipment checked  Epidural Patient position: sitting Prep: site prepped and draped and DuraPrep Patient monitoring: continuous pulse ox and blood pressure Approach: midline Location: L3-L4 Injection technique: LOR air  Needle:  Needle type: Tuohy  Needle gauge: 17 G Needle length: 9 cm and 9 Needle insertion depth: 5 cm cm Catheter type: closed end flexible Catheter size: 19 Gauge Catheter at skin depth: 10 cm Test dose: negative and Other  Assessment Events: blood not aspirated, injection not painful, no injection resistance, negative IV test and no paresthesia  Additional Notes Patient identified. Risks and benefits discussed including failed block, incomplete  Pain control, post dural puncture headache, nerve damage, paralysis, blood pressure Changes, nausea, vomiting, reactions to medications-both toxic and allergic and post Partum back pain. All questions were answered. Patient expressed understanding and wished to proceed. Sterile technique was used throughout procedure. Epidural site was Dressed with sterile barrier dressing. No paresthesias, signs of intravascular injection Or signs of intrathecal spread were encountered.  Patient was more comfortable after the epidural was dosed. Please see RN's note for documentation of vital signs and FHR which are stable.

## 2015-04-23 NOTE — H&P (Signed)
Amber Powell is a 39 y.o. female presenting for IOL/TOLAC.  Patient had one previous C/S followed by successful VBAC.  Patient is AMA with nl NIPT; previous pregnancy Tri 18.  GBS positive.  Patient was admitted at midnight and started on low dose pitocin.  She is feeling mild cramping.    Maternal Medical History:  Fetal activity: Perceived fetal activity is normal.   Last perceived fetal movement was within the past hour.    Prenatal complications: no prenatal complications Prenatal Complications - Diabetes: none.    OB History    Gravida Para Term Preterm AB TAB SAB Ectopic Multiple Living   Past Medical History  Diagnosis Date  . Anemia     during pregnancy  . Headache   . Hx of varicella   . Asthma   . Depression     hx mild pp depression   Past Surgical History  Procedure Laterality Date  . Cesarean section      emergency  . Wisdom tooth extraction     Family History: family history includes Cancer in her maternal grandfather, maternal grandmother, and paternal grandfather; Diabetes in her mother; Heart attack in her father; Hyperlipidemia in her mother; Hypertension in her mother; Other in her child. Social History:  reports that she quit smoking about 8 years ago. Her smoking use included Cigarettes. She has never used smokeless tobacco. She reports that she drinks alcohol. She reports that she does not use illicit drugs.   Prenatal Transfer Tool  Maternal Diabetes: No Genetic Screening: Normal Maternal Ultrasounds/Referrals: Normal Fetal Ultrasounds or other Referrals:  None Maternal Substance Abuse:  No Significant Maternal Medications:  None Significant Maternal Lab Results:  Lab values include: Group B Strep positive Other Comments:  None  ROS  Dilation: 3 Effacement (%): 20 Station: -2 Exam by:: Loren Sawaya Blood pressure 161/79, pulse 67, temperature 97.3 F (36.3 C), temperature source Oral, resp. rate 18, height 5' 5.5" (1.664 m),  weight 201 lb (91.173 kg). Maternal Exam:  Uterine Assessment: Contraction strength is moderate.  Contraction frequency is regular.   Abdomen: Patient reports no abdominal tenderness. Surgical scars: low transverse.   Fundal height is c/w dates.   Estimated fetal weight is 9#.   Fetal presentation: vertex  Introitus: Normal vulva. Pelvis: adequate for delivery.   Cervix: Cervix evaluated by digital exam.     Physical Exam  Constitutional: She is oriented to person, place, and time. She appears well-developed and well-nourished.  GI: Soft. There is no rebound and no guarding.  Neurological: She is alert and oriented to person, place, and time.  Skin: Skin is warm and dry.  Psychiatric: She has a normal mood and affect. Her behavior is normal.    Prenatal labs: ABO, Rh: --/--/A POS, A POS (10/10 0040) Antibody: NEG (10/10 0040) Rubella: Immune (03/08 0000) RPR: Nonreactive (03/08 0000)  HBsAg: Negative (03/08 0000)  HIV: Non-reactive (03/08 0000)  GBS: Positive (09/19 0000)   Assessment/Plan: 38yo [id number] at [redacted]w[redacted]d for IOL/TOLAC -Will continue low dose pitocin -Epidural -Anticipate NSVD -PCN for GBS pos   Amber Powell 04/23/2015, 8:17 AM

## 2015-04-23 NOTE — Anesthesia Preprocedure Evaluation (Signed)
Anesthesia Evaluation  Patient identified by MRN, date of birth, ID band Patient awake    Reviewed: Allergy & Precautions, Patient's Chart, lab work & pertinent test results  Airway Mallampati: II  TM Distance: >3 FB Neck ROM: Full    Dental no notable dental hx. (+) Teeth Intact   Pulmonary asthma , former smoker,    Pulmonary exam normal breath sounds clear to auscultation       Cardiovascular negative cardio ROS Normal cardiovascular exam Rhythm:Regular Rate:Normal     Neuro/Psych  Headaches, PSYCHIATRIC DISORDERS Depression    GI/Hepatic Neg liver ROS, GERD  Medicated and Controlled,  Endo/Other  Obesity  Renal/GU negative Renal ROS  negative genitourinary   Musculoskeletal negative musculoskeletal ROS (+)   Abdominal   Peds  Hematology  (+) anemia ,   Anesthesia Other Findings   Reproductive/Obstetrics (+) Pregnancy Previous C/Section                             Anesthesia Physical Anesthesia Plan  ASA: II  Anesthesia Plan: Epidural   Post-op Pain Management:    Induction:   Airway Management Planned: Natural Airway  Additional Equipment:   Intra-op Plan:   Post-operative Plan:   Informed Consent: I have reviewed the patients History and Physical, chart, labs and discussed the procedure including the risks, benefits and alternatives for the proposed anesthesia with the patient or authorized representative who has indicated his/her understanding and acceptance.     Plan Discussed with: Anesthesiologist  Anesthesia Plan Comments:         Anesthesia Quick Evaluation

## 2015-04-24 ENCOUNTER — Encounter (HOSPITAL_COMMUNITY): Payer: Self-pay

## 2015-04-24 LAB — CBC
HEMATOCRIT: 36.5 % (ref 36.0–46.0)
HEMOGLOBIN: 12 g/dL (ref 12.0–15.0)
MCH: 28.4 pg (ref 26.0–34.0)
MCHC: 32.9 g/dL (ref 30.0–36.0)
MCV: 86.3 fL (ref 78.0–100.0)
Platelets: 207 10*3/uL (ref 150–400)
RBC: 4.23 MIL/uL (ref 3.87–5.11)
RDW: 14.5 % (ref 11.5–15.5)
WBC: 15.2 10*3/uL — ABNORMAL HIGH (ref 4.0–10.5)

## 2015-04-24 MED ORDER — ONDANSETRON HCL 4 MG/2ML IJ SOLN: 4.0000 mg | INTRAMUSCULAR | Status: DC | PRN

## 2015-04-24 MED ORDER — ACETAMINOPHEN 325 MG PO TABS: 650.0000 mg | ORAL_TABLET | ORAL | Status: DC | PRN

## 2015-04-24 MED ORDER — PRENATAL MULTIVITAMIN CH
1.0000 | ORAL_TABLET | Freq: Every day | ORAL | Status: DC
  Administered 2015-04-24 – 2015-04-25 (×2): 1 via ORAL
  Filled 2015-04-24 (×2): qty 1

## 2015-04-24 MED ORDER — DIBUCAINE 1 % RE OINT: 1.0000 | TOPICAL_OINTMENT | RECTAL | Status: DC | PRN

## 2015-04-24 MED ORDER — SIMETHICONE 80 MG PO CHEW: 80.0000 mg | CHEWABLE_TABLET | ORAL | Status: DC | PRN

## 2015-04-24 MED ORDER — OXYCODONE-ACETAMINOPHEN 5-325 MG PO TABS: 2.0000 | ORAL_TABLET | ORAL | Status: DC | PRN

## 2015-04-24 MED ORDER — TETANUS-DIPHTH-ACELL PERTUSSIS 5-2.5-18.5 LF-MCG/0.5 IM SUSP: 0.5000 mL | Freq: Once | INTRAMUSCULAR | Status: DC

## 2015-04-24 MED ORDER — BENZOCAINE-MENTHOL 20-0.5 % EX AERO: 1.0000 "application " | INHALATION_SPRAY | CUTANEOUS | Status: DC | PRN

## 2015-04-24 MED ORDER — WITCH HAZEL-GLYCERIN EX PADS: 1.0000 "application " | MEDICATED_PAD | CUTANEOUS | Status: DC | PRN

## 2015-04-24 MED ORDER — SENNOSIDES-DOCUSATE SODIUM 8.6-50 MG PO TABS
2.0000 | ORAL_TABLET | ORAL | Status: DC
  Administered 2015-04-24: 2 via ORAL
  Filled 2015-04-24: qty 2

## 2015-04-24 MED ORDER — LANOLIN HYDROUS EX OINT: TOPICAL_OINTMENT | CUTANEOUS | Status: DC | PRN

## 2015-04-24 MED ORDER — ONDANSETRON HCL 4 MG PO TABS: 4.0000 mg | ORAL_TABLET | ORAL | Status: DC | PRN

## 2015-04-24 MED ORDER — OXYCODONE-ACETAMINOPHEN 5-325 MG PO TABS
1.0000 | ORAL_TABLET | ORAL | Status: DC | PRN
  Administered 2015-04-24: 1 via ORAL
  Filled 2015-04-24: qty 1

## 2015-04-24 MED ORDER — ZOLPIDEM TARTRATE 5 MG PO TABS: 5.0000 mg | ORAL_TABLET | Freq: Every evening | ORAL | Status: DC | PRN

## 2015-04-24 MED ORDER — IBUPROFEN 600 MG PO TABS
600.0000 mg | ORAL_TABLET | Freq: Four times a day (QID) | ORAL | Status: DC
  Administered 2015-04-24 – 2015-04-25 (×6): 600 mg via ORAL
  Filled 2015-04-24 (×6): qty 1

## 2015-04-24 MED ORDER — DIPHENHYDRAMINE HCL 25 MG PO CAPS: 25.0000 mg | ORAL_CAPSULE | Freq: Four times a day (QID) | ORAL | Status: DC | PRN

## 2015-04-24 NOTE — Anesthesia Postprocedure Evaluation (Signed)
  Anesthesia Post-op Note  Patient: Amber Powell  Procedure(s) Performed: * No procedures listed *  Patient Location: Mother/Baby  Anesthesia Type:Epidural  Level of Consciousness: awake, alert  and oriented  Airway and Oxygen Therapy: Patient Spontanous Breathing  Post-op Pain: none  Post-op Assessment: Post-op Vital signs reviewed and Patient's Cardiovascular Status Stable              Post-op Vital Signs: Reviewed and stable  Last Vitals:  Filed Vitals:   04/24/15 0558  BP: 128/74  Pulse: 80  Temp: 36.9 C  Resp: 18    Complications: No apparent anesthesia complications

## 2015-04-24 NOTE — Lactation Note (Addendum)
This note was copied from the chart of Amber Powell. Lactation Consultation Note Experienced BF mom for 6 months to her daughter who is now 40 yrs old. Mom holding baby BF in cradle position, heard clicking noise. Gentle chin tug done. Adjusted flange, still heard clicking. Asked mom to [place] baby. The top half part of moms nipples was protruding out as if that was all the baby was latched to. Asked mom to get into football position and use pillows for positioning props. Educated on positioning and body alignment of baby. Hand expressed colostrum. Mom has large short shaft nipple. Mom had been wearing shells for sore nipples. Gave mom shells for inverted nipples to assist shaft length. Latched baby with gentle chin tug. No clicking heard. After about 10 min. Of BF, baby had large emesis.  Educated about newborn behavior, importance of I&O, cluster feeding, supply and demand. Mom encouraged to feed baby 8-12 times/24 hours and with feeding cues. Referred to Baby and Me Book in Breastfeeding section Pg. 22-23 for position options and Proper latch demonstration. Encouraged comfort during BF so colostrum flows better and mom will enjoy the feeding longer. Taking deep breaths and breast massage during BF. Hand pump given to assist in everting nipple prior to latch. #27 and 30 flanges given for hand pump d/t large nipple. WH/LC brochure given w/resources, support groups and LC services.  Patient Name: Amber Powell [id number] Date: 04/24/2015 Reason for consult: Initial assessment   Maternal Data Has patient been taught Hand Expression?: Yes Does the patient have breastfeeding experience prior to this delivery?: Yes  Feeding Feeding Type: Breast Fed Length of feed: 10 min  LATCH Score/Interventions Latch: Repeated attempts needed to sustain latch, nipple held in mouth throughout feeding, stimulation needed to elicit sucking reflex. Intervention(s): Adjust position;Assist with latch;Breast  massage;Breast compression  Audible Swallowing: A few with stimulation Intervention(s): Skin to skin;Hand expression;Alternate breast massage  Type of Nipple: Everted at rest and after stimulation (large nipples short shaft)  Comfort (Breast/Nipple): Soft / non-tender     Hold (Positioning): Assistance needed to correctly position infant at breast and maintain latch. Intervention(s): Breastfeeding basics reviewed;Support Pillows;Position options;Skin to skin  LATCH Score: 7  Lactation Tools Discussed/Used Tools: Shells;Pump;Flanges Flange Size: 30 Shell Type: Inverted Breast pump type: Manual Pump Review: Setup, frequency, and cleaning;Milk Storage Initiated by:: Peri Jefferson RN Date initiated:: 04/24/15   Consult Status Date: 04/25/15 Follow-up type: In-patient    Charyl Dancer 04/24/2015, 1:41 PM

## 2015-04-24 NOTE — Plan of Care (Signed)
Problem: Phase I Progression Outcomes Goal: Voiding adequately Outcome: Not Progressing No void, yet - post delivery Goal: OOB as tolerated unless otherwise ordered Outcome: Progressing With stedy

## 2015-04-24 NOTE — Clinical Social Work Maternal (Signed)
  CLINICAL SOCIAL WORK MATERNAL/CHILD NOTE  Patient Details  Name: Amber Powell MRN: [id number] Date of Birth: 10/13/1975  Date:  04/24/2015  Clinical Social Worker Initiating Note:  Hutchinson Isenberg MSW, LCSW Date/ Time Initiated:  04/24/15/1500     Child's Name:  Amber Powell   Legal Guardian:  Amber Powell and Amber Powell  Need for Interpreter:  None   Date of Referral:  04/23/15     Reason for Referral:  History of PPD  Referral Source:  Central Nursery   Address:  [address] [place], [place] [address]  Phone number:  [phone]   Household Members:  Minor Children, Spouse   Natural Supports (not living in the home):  Church, Immediate Family, Friends   Professional Supports: Therapist   Employment: Full-time   Type of Work: Hair Stylist   Education:    N/A  Financial Resources:  Private Insurance   Other Resources:    None identified   Cultural/Religious Considerations Which May Impact Care:  None reported  Strengths:  Ability to meet basic needs , Pediatrician chosen , Home prepared for child    Risk Factors/Current Problems:   1)Mental Health Concerns: History of postpartum anxiety after her 9 year old daughter was born.    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  FOB and 9 year old daughter present in the room, but FOB interacted with daughter in order to provide CSW with ability to complete assessment with MOB.  MOB reported feeling "tired" and "exhausted", but was noted to be in a pleasant mood and displayed a full range in affect.  MOB endorsed feelings of happiness and excitement secondary to the infant's birth, and endorsed feelings of satisfaction and contentment secondary to infant's birth story and her transition postpartum. MOB reported  presence of strong support system, and shared belief that she has the support of family, friends, and a church community. She endorsed feelings of comfort in utilizing and asking for help if needs arise.   MOB acknowledged history of postpartum depression after her daughter was born. She stated that she experienced onset of symptoms when her daughter was almost one year ago. Per MOB, there were additional stressors present that complicated the situation, and reported that she was prescribed medication to assist with anxiety symptoms. MOB unable to recall name of medication, but shared belief that she experience more perinatal anxiety versus depression.  MOB stated that she currently participates in therapy, and continues to engage in therapy postpartum.  She denied any acute mental health symptoms during the pregnancy, but acknowledged her increased risk due to prior history. MOB expressed confidence in her ability to express her thoughts and feelings to her OB and her therapist if needs arise postpartum.  MOB expressed appreciation for the visit, acknowledged ongoing CSW availability, and agreed to contact CSW if needs arise during the admission.   CSW Plan/Description:   1)Patient/Family Education: Perinatal mood disorders, including Feelings After Birth Support Group 2) MOB to follow up with current outpatient therapist for ongoing mental health support. No follow up appointment scheduled, but MOB voiced intention to schedule appointment as she transitions postpartum. 3)No Further Intervention Required/No Barriers to Discharge    Ambriella Kitt N, LCSW 04/24/2015, 3:21 PM  

## 2015-04-24 NOTE — Progress Notes (Signed)
Post Partum Day 1 Subjective: no complaints, up ad lib, voiding and tolerating PO  Objective: Blood pressure 128/74, pulse 80, temperature 98.5 F (36.9 C), temperature source Oral, resp. rate 18, height 5' 5.5" (1.664 m), weight 201 lb (91.173 kg), SpO2 99 %, unknown if currently breastfeeding.  Physical Exam:  General: alert and cooperative Lochia: appropriate Uterine Fundus: firm Incision: healing well DVT Evaluation: No evidence of DVT seen on physical exam. Negative Homan's sign. No cords or calf tenderness. Calf/Ankle edema is present.   Recent Labs  04/23/15 0040 04/24/15 0550  HGB 12.5 12.0  HCT 37.5 36.5    Assessment/Plan: Plan for discharge tomorrow   LOS: 1 day   Teren Zurcher G 04/24/2015, 8:00 AM

## 2015-04-25 MED ORDER — IBUPROFEN 600 MG PO TABS: 600.0000 mg | ORAL_TABLET | Freq: Four times a day (QID) | ORAL | Status: DC

## 2015-04-25 MED ORDER — OXYCODONE-ACETAMINOPHEN 5-325 MG PO TABS: 1.0000 | ORAL_TABLET | ORAL | Status: DC | PRN

## 2015-04-25 NOTE — Discharge Summary (Signed)
Obstetric Discharge Summary Reason for Admission: induction of labor Prenatal Procedures: ultrasound Intrapartum Procedures: spontaneous vaginal delivery Postpartum Procedures: none Complications-Operative and Postpartum: 2 degree perineal laceration HEMOGLOBIN  Date Value Ref Range Status  04/24/2015 12.0 12.0 - 15.0 g/dL Final   HCT  Date Value Ref Range Status  04/24/2015 36.5 36.0 - 46.0 % Final    Physical Exam:  General: alert and cooperative Lochia: appropriate Uterine Fundus: firm Incision: healing well DVT Evaluation: No evidence of DVT seen on physical exam. Negative Homan's sign. No cords or calf tenderness. No significant calf/ankle edema.  Discharge Diagnoses: Term Pregnancy-delivered  Discharge Information: Date: 04/25/2015 Activity: pelvic rest Diet: routine Medications: PNV, Ibuprofen and Percocet Condition: stable Instructions: refer to practice specific booklet Discharge to: home   Newborn Data: Live born female  Birth Weight: 9 lb 0.8 oz (4105 g) APGAR: 8, 9  Home with mother.  Ladon Heney G 04/25/2015, 8:20 AM

## 2015-05-01 ENCOUNTER — Inpatient Hospital Stay (HOSPITAL_COMMUNITY)
Admission: AD | Admit: 2015-05-01 | Discharge: 2015-05-01 | Disposition: A | Discharge status: Home / Self Care | Payer: 59 | Source: Ambulatory Visit | Attending: Emergency Medicine | Admitting: Emergency Medicine

## 2015-05-01 ENCOUNTER — Inpatient Hospital Stay (HOSPITAL_COMMUNITY): Payer: 59

## 2015-05-01 ENCOUNTER — Encounter (HOSPITAL_COMMUNITY): Payer: Self-pay | Admitting: *Deleted

## 2015-05-01 DIAGNOSIS — R0602 Shortness of breath: Secondary | ICD-10-CM

## 2015-05-01 DIAGNOSIS — Z87891 Personal history of nicotine dependence: Secondary | ICD-10-CM | POA: Insufficient documentation

## 2015-05-01 DIAGNOSIS — J209 Acute bronchitis, unspecified: Secondary | ICD-10-CM | POA: Diagnosis not present

## 2015-05-01 DIAGNOSIS — J45901 Unspecified asthma with (acute) exacerbation: Secondary | ICD-10-CM | POA: Diagnosis not present

## 2015-05-01 DIAGNOSIS — Z79899 Other long term (current) drug therapy: Secondary | ICD-10-CM | POA: Insufficient documentation

## 2015-05-01 DIAGNOSIS — O9903 Anemia complicating the puerperium: Secondary | ICD-10-CM | POA: Diagnosis not present

## 2015-05-01 DIAGNOSIS — O9953 Diseases of the respiratory system complicating the puerperium: Secondary | ICD-10-CM | POA: Diagnosis not present

## 2015-05-01 DIAGNOSIS — Z8659 Personal history of other mental and behavioral disorders: Secondary | ICD-10-CM | POA: Diagnosis not present

## 2015-05-01 LAB — CBC WITH DIFFERENTIAL/PLATELET
Basophils Absolute: 0 10*3/uL (ref 0.0–0.1)
Basophils Relative: 0 %
EOS ABS: 0.3 10*3/uL (ref 0.0–0.7)
Eosinophils Relative: 3 %
HEMATOCRIT: 39.7 % (ref 36.0–46.0)
HEMOGLOBIN: 13.1 g/dL (ref 12.0–15.0)
LYMPHS ABS: 1.8 10*3/uL (ref 0.7–4.0)
Lymphocytes Relative: 20 %
MCH: 28.3 pg (ref 26.0–34.0)
MCHC: 33 g/dL (ref 30.0–36.0)
MCV: 85.7 fL (ref 78.0–100.0)
MONOS PCT: 7 %
Monocytes Absolute: 0.6 10*3/uL (ref 0.1–1.0)
Neutro Abs: 6.4 10*3/uL (ref 1.7–7.7)
Neutrophils Relative %: 70 %
Platelets: 311 10*3/uL (ref 150–400)
RBC: 4.63 MIL/uL (ref 3.87–5.11)
RDW: 14.2 % (ref 11.5–15.5)
WBC: 9.1 10*3/uL (ref 4.0–10.5)

## 2015-05-01 LAB — URINALYSIS, ROUTINE W REFLEX MICROSCOPIC
Bilirubin Urine: NEGATIVE
Glucose, UA: NEGATIVE mg/dL
Ketones, ur: NEGATIVE mg/dL
Leukocytes, UA: NEGATIVE
NITRITE: NEGATIVE
Protein, ur: NEGATIVE mg/dL
SPECIFIC GRAVITY, URINE: 1.02 (ref 1.005–1.030)
UROBILINOGEN UA: 0.2 mg/dL (ref 0.0–1.0)
pH: 6 (ref 5.0–8.0)

## 2015-05-01 LAB — COMPREHENSIVE METABOLIC PANEL
ALBUMIN: 3.2 g/dL — AB (ref 3.5–5.0)
ALK PHOS: 80 U/L (ref 38–126)
ALT: 22 U/L (ref 14–54)
AST: 15 U/L (ref 15–41)
Anion gap: 3 — ABNORMAL LOW (ref 5–15)
BILIRUBIN TOTAL: 0.7 mg/dL (ref 0.3–1.2)
BUN: 18 mg/dL (ref 6–20)
CALCIUM: 8.6 mg/dL — AB (ref 8.9–10.3)
CO2: 26 mmol/L (ref 22–32)
CREATININE: 0.61 mg/dL (ref 0.44–1.00)
Chloride: 109 mmol/L (ref 101–111)
GFR calc non Af Amer: >60 mL/min (ref >60)
GLUCOSE: 84 mg/dL (ref 65–99)
Potassium: 4.2 mmol/L (ref 3.5–5.1)
SODIUM: 138 mmol/L (ref 135–145)
TOTAL PROTEIN: 6.6 g/dL (ref 6.5–8.1)

## 2015-05-01 LAB — PROTEIN / CREATININE RATIO, URINE
Creatinine, Urine: 81 mg/dL
Protein Creatinine Ratio: 0.1 mg/mg{Cre} (ref 0.00–0.15)
Total Protein, Urine: 8 mg/dL

## 2015-05-01 LAB — BRAIN NATRIURETIC PEPTIDE: B Natriuretic Peptide: 38 pg/mL (ref 0.0–100.0)

## 2015-05-01 LAB — URINE MICROSCOPIC-ADD ON

## 2015-05-01 LAB — D-DIMER, QUANTITATIVE: D-Dimer, Quant: 10.21 ug/mL-FEU — ABNORMAL HIGH (ref 0.00–0.48)

## 2015-05-01 MED ORDER — IOHEXOL 350 MG/ML SOLN
80.0000 mL | Freq: Once | INTRAVENOUS | Status: AC | PRN
  Administered 2015-05-01: 70 mL via INTRAVENOUS

## 2015-05-01 MED ORDER — AZITHROMYCIN 250 MG PO TABS: 250.0000 mg | ORAL_TABLET | Freq: Every day | ORAL | Status: DC

## 2015-05-01 MED ORDER — BUTALBITAL-APAP-CAFFEINE 50-325-40 MG PO TABS
1.0000 | ORAL_TABLET | Freq: Once | ORAL | Status: AC
  Administered 2015-05-01: 1 via ORAL
  Filled 2015-05-01: qty 1

## 2015-05-01 MED ORDER — ALBUTEROL SULFATE HFA 108 (90 BASE) MCG/ACT IN AERS: 2.0000 | INHALATION_SPRAY | RESPIRATORY_TRACT | Status: DC | PRN

## 2015-05-01 NOTE — ED Notes (Addendum)
Pt states on Saturday she started having a dry cough that has progressively gotten worse and is now short of breath worse on exertion. Pt also c/o tightness in her chest that has been intermittent. Denies any pain at this time. Pt has dry cough at triage but no noted to be in any respiratory distress. [facility] d dimer for possible CT scan.

## 2015-05-01 NOTE — MAU Note (Signed)
Urine in lab 

## 2015-05-01 NOTE — MAU Provider Note (Signed)
CSN: [id number]     Arrival date & time 05/01/15  1317 History    First Provider Initiated Contact with Patient 05/01/15 1422      Chief Complaint  Patient presents with  . Shortness of Breath  . Cough   The patient is a 39 year old gravida 5 para 2122 at 8 days status post spontaneous vaginal delivery/VBAC who presents to maternity admissions with shortness of breath, chest pressure and cough. Cough started approximately 3 days ago. She reports mild shortness of breath starting over the past 24 hours that became much worse today. No history of similar symptoms no history of heart problems. Has history of mild exercise-induced asthma in high school but nothing since then. Does not use an inhaler. No known allergies. No swelling of lips or tongue.  Had transient hypertension of pregnancy in the third trimester and elevated liver enzymes on labs in the office, but negative preeclampsia workup and no further elevation of liver enzymes. No recent surgeries, prolonged immobilization or travel.  (Consider location/radiation/quality/duration/timing/severity/associated sxs/prior Treatment) Shortness of Breath This is a new problem. The current episode started today. The problem occurs constantly. The problem has been unchanged. The average episode lasts 12 hours. Pertinent negatives include no abdominal pain, chest pain, fever, headaches, hemoptysis, leg pain, leg swelling, PND, rhinorrhea, sore throat, sputum production or wheezing. The symptoms are aggravated by any activity. Associated symptoms comments: Chest pressure. Risk factors: 8 days postpartum vaginal delivery. She has tried nothing for the symptoms. Her past medical history is significant for asthma (exercise induced as teen (none recent)). There is no history of CAD, chronic lung disease, COPD, DVT, a heart failure, PE, pneumonia or a recent surgery.  Cough Associated symptoms include shortness of breath. Pertinent negatives include no chest  pain, chills, fever, headaches, hemoptysis, myalgias, rhinorrhea, sore throat or wheezing. Her past medical history is significant for asthma (exercise induced as teen (none recent)). There is no history of COPD or pneumonia.    Past Medical History  Diagnosis Date  . Anemia     during pregnancy  . Headache   . Hx of varicella   . Asthma   . Depression     hx mild pp depression   Past Surgical History  Procedure Laterality Date  . Cesarean section      emergency  . Wisdom tooth extraction     Family History  Problem Relation Age of Onset  . Hypertension Mother   . Diabetes Mother   . Hyperlipidemia Mother   . Cancer Maternal Grandmother     lung  . Cancer Maternal Grandfather     unknown type  . Cancer Paternal Grandfather     lung  . Other Child     trisomy 66, deceased  . Heart attack Father    Social History  Substance Use Topics  . Smoking status: Former Smoker    Types: Cigarettes    Quit date: 07/14/2006  . Smokeless tobacco: Never Used  . Alcohol Use: Yes     Comment: very little, not while preg   OB History    Gravida Para Term Preterm AB TAB SAB Ectopic Multiple Living   0 2     Review of Systems  Constitutional: Positive for fatigue. Negative for fever and chills.  HENT: Negative for congestion, rhinorrhea, sinus pressure and sore throat.   Eyes: Negative for visual disturbance.  Respiratory: Positive for cough and shortness of breath. Negative for hemoptysis,  sputum production and wheezing.   Cardiovascular: Negative for chest pain, leg swelling and PND.  Gastrointestinal: Negative for abdominal pain.  Musculoskeletal: Negative for myalgias.  Neurological: Negative for headaches.   Allergies  Review of patient's allergies indicates no known allergies.  Home Medications   Prior to Admission medications   Medication Sig Start Date End Date Taking? Authorizing Provider  FENUGREEK PO Take 1 tablet by mouth 2 (two) times daily.    Yes Historical Provider, MD  ibuprofen (ADVIL,MOTRIN) 200 MG tablet Take 400 mg by mouth every 6 (six) hours as needed for moderate pain.   Yes Historical Provider, MD  omega-3 acid ethyl esters (LOVAZA) 1 G capsule Take 1 g by mouth 2 (two) times daily.   Yes Historical Provider, MD  Prenatal Vit-Fe Fumarate-FA (PRENATAL MULTIVITAMIN) TABS tablet Take 1 tablet by mouth daily at 12 noon.   Yes Historical Provider, MD  ibuprofen (ADVIL,MOTRIN) 600 MG tablet Take 1 tablet (600 mg total) by mouth every 6 (six) hours. Patient not taking: Reported on 05/01/2015 04/25/15   Julio Sicksarol Curtis, NP  oxyCODONE-acetaminophen (PERCOCET/ROXICET) 5-325 MG tablet Take 1 tablet by mouth every 4 (four) hours as needed (for pain scale 4-7). Patient not taking: Reported on 05/01/2015 04/25/15   Julio Sicksarol Curtis, NP   BP 126/79 mmHg  Pulse 80  Temp(Src) 98.4 F (36.9 C) (Oral)  Resp 18  SpO2 99%  Patient Vitals for the past 24 hrs:  BP Temp Temp src Pulse Resp SpO2  05/01/15 1633 143/82 mmHg - - 78 16 100 %  05/01/15 1610 153/93 mmHg - - 79 16 100 %  05/01/15 1455 126/79 mmHg - - 80 - 99 %  05/01/15 1352 - - - 91 - 96 %  05/01/15 1343 121/68 mmHg 98.4 F (36.9 C) Oral 77 18 98 %    Physical Exam  Constitutional: She is oriented to person, place, and time. Vital signs are normal. She appears well-developed and well-nourished.  Non-toxic appearance. She does not have a sickly appearance. She does not appear ill. No distress.  Eyes: No scleral icterus.  Cardiovascular: Normal rate, regular rhythm and normal heart sounds.   Occasional extrasystoles are present. Exam reveals no gallop and no friction rub.   No murmur heard. Pulmonary/Chest: Breath sounds normal. No accessory muscle usage. No respiratory distress. She has no decreased breath sounds. She has no wheezes. She has no rales.  Increased work of breathing w/ ambulation   Musculoskeletal:       Right lower leg: She exhibits edema. She exhibits no tenderness  and no swelling.       Left lower leg: She exhibits edema. She exhibits no tenderness and no swelling.  Trace pedal edema  Neurological: She is alert and oriented to person, place, and time. She has normal reflexes.  Skin: Skin is warm and dry. She is not diaphoretic.  Psychiatric: She has a normal mood and affect.  Nursing note and vitals reviewed.   ED Course  Procedures (including critical care time) Labs Review Results for orders placed or performed during the hospital encounter of 05/01/15 (from the past 24 hour(s))  CBC with Differential/Platelet     Status: None   Collection Time: 05/01/15  2:20 PM  Result Value Ref Range   WBC 9.1 4.0 - 10.5 K/uL   RBC 4.63 3.87 - 5.11 MIL/uL   Hemoglobin 13.1 12.0 - 15.0 g/dL   HCT 16.139.7 09.636.0 - 04.546.0 %   MCV 85.7 78.0 - 100.0 fL  MCH 28.3 26.0 - 34.0 pg   MCHC 33.0 30.0 - 36.0 g/dL   RDW 16.1 09.6 - 04.5 %   Platelets 311 150 - 400 K/uL   Neutrophils Relative % 70 %   Neutro Abs 6.4 1.7 - 7.7 K/uL   Lymphocytes Relative 20 %   Lymphs Abs 1.8 0.7 - 4.0 K/uL   Monocytes Relative 7 %   Monocytes Absolute 0.6 0.1 - 1.0 K/uL   Eosinophils Relative 3 %   Eosinophils Absolute 0.3 0.0 - 0.7 K/uL   Basophils Relative 0 %   Basophils Absolute 0.0 0.0 - 0.1 K/uL  Comprehensive metabolic panel     Status: Abnormal   Collection Time: 05/01/15  2:20 PM  Result Value Ref Range   Sodium 138 135 - 145 mmol/L   Potassium 4.2 3.5 - 5.1 mmol/L   Chloride 109 101 - 111 mmol/L   CO2 26 22 - 32 mmol/L   Glucose, Bld 84 65 - 99 mg/dL   BUN 18 6 - 20 mg/dL   Creatinine, Ser 4.09 0.44 - 1.00 mg/dL   Calcium 8.6 (L) 8.9 - 10.3 mg/dL   Total Protein 6.6 6.5 - 8.1 g/dL   Albumin 3.2 (L) 3.5 - 5.0 g/dL   AST 15 15 - 41 U/L   ALT 22 14 - 54 U/L   Alkaline Phosphatase 80 38 - 126 U/L   Total Bilirubin 0.7 0.3 - 1.2 mg/dL   GFR calc non Af Amer >60 >60 mL/min   GFR calc Af Amer >60 >60 mL/min   Anion gap 3 (L) 5 - 15  D-dimer, quantitative (not at [facility])      Status: Abnormal   Collection Time: 05/01/15  2:20 PM  Result Value Ref Range   D-Dimer, Quant 10.21 (H) 0.00 - 0.48 ug/mL-FEU   EKG Preliminary report: Normal sinus rhythm with sinus arrhythmia. Possible left atrial enlargement. Dr. Elita Boone, cardiologist reviewed EKG remotely. States EKG is normal.    MDM  39 year old female 8 days) 1 days vaginal delivery with shortness of breath chest pressure and cough. Patient is slightly tired-appearing with mild increased work of breathing with ambulation, but otherwise healthy and nontoxic appearing.  Differential diagnoses include PE, pneumonia, respiratory infection, cardiomyopathy. Will check CBC, d-dimer, CMP and EKG and then determine what imaging is needed.  Discussed EKG, patient's symptoms and elevated d-dimer with cardiologist Donato Schultz. EKG is normal. D-dimer significantly elevated. May be related to postpartum state, but normal degree of elevation of d-dimer and postpartum state is unknown. Recommend CT to evaluate for PE. This may also show cardiomegaly if present. If testing is negative for PE, Dr. Anne Fu made appointment to see patient in his office tomorrow at 945.  Discussed patient symptoms, labs, EKG and recommendation of cardiologist with Dr. Marcelle Overlie. Dr. Marcelle Overlie wants patient transported to [facility] for CT and any additional evaluation and treatment. Discussed recommendations of Dr. Marcelle Overlie and Dr. Anne Fu with patient and family. Patient agrees to transport to [facility]. Patient stable for transport.  Blood pressures initially normal in maternity admissions, but were elevated later in visit. Protein creatinine ratio ordered. Patient denies preeclampsia symptoms (headache, vision changes or epigastric pain).  ASSESSMENT Final diagnoses:  SOB (shortness of breath)  Postpartum state   PLAN Transfer to [facility] ED by [facility]. Dr. Preston Fleeting accepting M.D. If not admitted, patient scheduled follow-up visit with Dr. Anne Fu 05/02/2015 at 9:45  AM.  Dorathy Kinsman, CNM 05/01/2015 5:26 PM

## 2015-05-01 NOTE — Progress Notes (Signed)
To [facility] via [facility]

## 2015-05-01 NOTE — ED Provider Notes (Signed)
CSN: [id number]     Arrival date & time 05/01/15  1317 History   First MD Initiated Contact with Patient 05/01/15 1710     Chief Complaint  Patient presents with  . Shortness of Breath  . Cough     (Consider location/radiation/quality/duration/timing/severity/associated sxs/prior Treatment) HPI 39 year old female who presents with cough and shortness of breath. History of asthma and is 6 days postpartum from normal spontaneous vaginal delivery. Uncomplicated pregnancy and uncomplicated delivery. Reports initially being in her usual state of health. Over the course of the past 3-4 days has had cough that is not productive of sputum, with chest pressure and tightness as well as shortness of breath. Noticed that she went up the steps in her home today and felt very winded. Denies pleuritic nature of chest pain, lightheadedness, or syncope. Has not had fevers, chills, sputum, congestion, sore throat, or runny nose. Does not have any known sick contacts. Lower extremity edema improved since delivery. No leg pain.   Past Medical History  Diagnosis Date  . Anemia     during pregnancy  . Headache   . Hx of varicella   . Asthma   . Depression     hx mild pp depression   Past Surgical History  Procedure Laterality Date  . Cesarean section      emergency  . Wisdom tooth extraction     Family History  Problem Relation Age of Onset  . Hypertension Mother   . Diabetes Mother   . Hyperlipidemia Mother   . Cancer Maternal Grandmother     lung  . Cancer Maternal Grandfather     unknown type  . Cancer Paternal Grandfather     lung  . Other Child     trisomy 78, deceased  . Heart attack Father    Social History  Substance Use Topics  . Smoking status: Former Smoker    Types: Cigarettes    Quit date: 07/14/2006  . Smokeless tobacco: Never Used  . Alcohol Use: Yes     Comment: very little, not while preg   OB History    Gravida Para Term Preterm AB TAB SAB Ectopic Multiple Living    0 2     Review of Systems Physical Exam  Nursing note and vitals reviewed. Constitutional: Well developed, well nourished, non-toxic, and in no acute distress Head: Normocephalic and atraumatic.  Mouth/Throat: Oropharynx is clear and moist.  Neck: Normal range of motion. Neck supple.  Cardiovascular: Normal rate and regular rhythm.   Pulmonary/Chest: Effort normal and breath sounds normal.  Abdominal: Soft. There is no tenderness. There is no rebound and no guarding.  Musculoskeletal: Normal range of motion.  Neurological: Alert, no facial droop, fluent speech, moves all extremities symmetrically Skin: Skin is warm and dry.  Psychiatric: Cooperative   Allergies  Review of patient's allergies indicates no known allergies.  Home Medications   Prior to Admission medications   Medication Sig Start Date End Date Taking? Authorizing Provider  FENUGREEK PO Take 1 tablet by mouth 2 (two) times daily.   Yes Historical Provider, MD  ibuprofen (ADVIL,MOTRIN) 200 MG tablet Take 400 mg by mouth every 6 (six) hours as needed for moderate pain.   Yes Historical Provider, MD  omega-3 acid ethyl esters (LOVAZA) 1 G capsule Take 1 g by mouth 2 (two) times daily.   Yes Historical Provider, MD  Prenatal Vit-Fe Fumarate-FA (PRENATAL MULTIVITAMIN) TABS tablet Take 1 tablet by mouth  daily at 12 noon.   Yes Historical Provider, MD  albuterol (PROVENTIL HFA;VENTOLIN HFA) 108 (90 BASE) MCG/ACT inhaler Inhale 2 puffs into the lungs every 4 (four) hours as needed for wheezing or shortness of breath. 05/01/15   Lavera Guise, MD  azithromycin (ZITHROMAX) 250 MG tablet Take 1 tablet (250 mg total) by mouth daily. Take first 2 tablets together, then 1 every day until finished. Please take if symptoms not improving after two weeks. 05/01/15   Lavera Guise, MD  ibuprofen (ADVIL,MOTRIN) 600 MG tablet Take 1 tablet (600 mg total) by mouth every 6 (six) hours. Patient not taking: Reported on 05/01/2015  04/25/15   Julio Sicks, NP  oxyCODONE-acetaminophen (PERCOCET/ROXICET) 5-325 MG tablet Take 1 tablet by mouth every 4 (four) hours as needed (for pain scale 4-7). Patient not taking: Reported on 05/01/2015 04/25/15   Julio Sicks, NP   BP 124/75 mmHg  Pulse 68  Temp(Src) 97.8 F (36.6 C) (Oral)  Resp 17  Ht  (1.651 m)  Wt 190 lb (86.183 kg)  BMI 31.62 kg/m2  SpO2 97%  LMP 06/30/2014  Breastfeeding? Yes Physical Exam Physical Exam  Nursing note and vitals reviewed. Constitutional: Well developed, well nourished, non-toxic, and in no acute distress Head: Normocephalic and atraumatic.  Mouth/Throat: Oropharynx is clear and moist.  Neck: Normal range of motion. Neck supple.  Cardiovascular: Normal rate and regular rhythm.  Trace symmetric pedal edema. Pulmonary/Chest: Effort normal and breath sounds normal. No chest wall tenderness. Abdominal: Soft. There is no tenderness. There is no rebound and no guarding.  Musculoskeletal: Normal range of motion.  Neurological: Alert, no facial droop, fluent speech, moves all extremities symmetrically Skin: Skin is warm and dry.  Psychiatric: Cooperative  ED Course  Procedures (including critical care time) Labs Review Labs Reviewed  COMPREHENSIVE METABOLIC PANEL - Abnormal; Notable for the following:    Calcium 8.6 (*)    Albumin 3.2 (*)    Anion gap 3 (*)    All other components within normal limits  D-DIMER, QUANTITATIVE (NOT AT [facility]) - Abnormal; Notable for the following:    D-Dimer, Quant 10.21 (*)    All other components within normal limits  URINALYSIS, ROUTINE W REFLEX MICROSCOPIC (NOT AT [facility]) - Abnormal; Notable for the following:    Hgb urine dipstick LARGE (*)    All other components within normal limits  CBC WITH DIFFERENTIAL/PLATELET  PROTEIN / CREATININE RATIO, URINE  URINE MICROSCOPIC-ADD ON  BRAIN NATRIURETIC PEPTIDE    Imaging Review Dg Chest 2 View  05/01/2015  CLINICAL DATA:  Cough and shortness of  breath for 4 days EXAM: CHEST - 2 VIEW COMPARISON:  None. FINDINGS: The heart size and mediastinal contours are within normal limits. Both lungs are clear. The visualized skeletal structures are unremarkable. IMPRESSION: No active disease. Electronically Signed   By: Alcide Clever M.D.   On: 05/01/2015 18:18   Ct Angio Chest Pe W/cm &/or Wo Cm  05/01/2015  CLINICAL DATA:  Shortness of breath and dyspnea on exertion, recent pregnancy. Productive cough, chest pressure. Symptoms for 3 days. EXAM: CT ANGIOGRAPHY CHEST WITH CONTRAST TECHNIQUE: Multidetector CT imaging of the chest was performed using the standard protocol during bolus administration of intravenous contrast. Multiplanar CT image reconstructions and MIPs were obtained to evaluate the vascular anatomy. CONTRAST:  70mL OMNIPAQUE IOHEXOL 350 MG/ML SOLN COMPARISON:  Radiographs earlier today. FINDINGS: There are no filling defects within the pulmonary arteries to suggest pulmonary embolus. The thoracic aorta is normal  in caliber, there is a conventional branching pattern from the aortic arch. Heart size is normal. No mediastinal, hilar or axillary adenopathy. No pleural or pericardial effusion. Diffuse bronchial thickening in the right lower lobe. The lungs are clear without consolidation, findings of pulmonary edema, nodule or mass. The included upper abdomen is normal. The esophagus is decompressed. The visualized thyroid gland appears normal. There are no acute or suspicious osseous abnormalities. Review of the MIP images confirms the above findings. IMPRESSION: 1. No pulmonary embolus. 2. Bronchial thickening of the right lower lobe lobar and segmental branches, suggestive of acute bronchitis. No consolidation. Electronically Signed   By: Rubye OaksMelanie  Ehinger M.D.   On: 05/01/2015 19:35   I have personally reviewed and evaluated these images and lab results as part of my medical decision-making.   EKG Interpretation   Date/Time:  Tuesday May 01 2015 17:18:43 EDT Ventricular Rate:  87 PR Interval:  143 QRS Duration: 97 QT Interval:  384 QTC Calculation: 462 R Axis:   28 Text Interpretation:  Sinus rhythm Probable left atrial enlargement  Baseline wander in lead(s) II III aVF No significant change since last  tracing Confirmed by Tammatha Cobb MD, Annabelle HarmanANA ([id number]) on 05/01/2015 5:32:58 PM      MDM   Final diagnoses:  SOB (shortness of breath)  Postpartum state  Acute bronchitis, unspecified organism    39 year old female 6 days postpartum from normal spontaneous vaginal delivery who presents with shortness of breath and cough. She is nontoxic and in no acute distress on presentation. No evidence of tachycardia, speaking in full sentences, breathing comfortably on room air with normal oxygenation. Lungs are clear to auscultation bilaterally. Blood pressure also within normal limits, and not concerning for preeclampsia. Chest x-ray showing no acute cardiopulmonary processes. BNP is normal, and no significant history suggestive of CHF, and unlikely to be postpartum cardiomyopathy. Given normal chest x-ray and a d-dimer that was performed by [facility] that is elevated, a CT PE is obtained. No evidence of PE, but findings consistent with acute bronchitis. Discussed supportive care with albuterol inhaler. I discussed need for possible Z-Pak/antibiotics if symptoms do not improve over 2 weeks. Strict return and follow-up instructions reviewed. She expressed understanding of all discharge instructions and felt comfortable with the plan of care.   Lavera Guiseana Duo Jewell Ryans, MD 05/01/15 [phone]

## 2015-05-01 NOTE — Discharge Instructions (Signed)

## 2015-05-01 NOTE — MAU Note (Signed)
Delivered 10/10, vaginal. No complications with pregnancy or delivery. States 2 days ago began to have some pressure in her chest, SOB and "deep cough."  States the cough is productive at times, but really concerned with pressure on her chest and states she is really SOB on exertion, ie going up steps.

## 2015-05-01 NOTE — Progress Notes (Signed)
Husband came to take baby home. [name], CNM discussed transfer with patient and husband.

## 2015-05-02 ENCOUNTER — Ambulatory Visit (INDEPENDENT_AMBULATORY_CARE_PROVIDER_SITE_OTHER): Payer: 59 | Admitting: Cardiology

## 2015-05-02 ENCOUNTER — Encounter: Payer: Self-pay | Admitting: Cardiology

## 2015-05-02 VITALS — BP 108/76 | HR 79 | Ht 65.0 in | Wt 180.0 lb

## 2015-05-02 DIAGNOSIS — R0602 Shortness of breath: Secondary | ICD-10-CM

## 2015-05-02 NOTE — Progress Notes (Signed)
Cardiology Office Note   Date:  05/02/2015   ID:  Amber BaloKelly Elsey, DOB 1976/03/21, MRN [id number]  PCP:  Garlan FillersPATERSON,DANIEL G, MD  Cardiologist:   Donato SchultzSKAINS, Tyteanna Ost, MD       History of Present Illness: Amber Powell is a 39 y.o. female who presents for evaluation of shortness of breath. She is approximately 10 days postpartum. No significant difficulties during reported pregnancy. Possibly a day ago she began to feel when she was considering more sudden onset shortness of breath. [facility] and a d-dimer was 10. I spoke personally with the physician's assistants taking care of her.  For about 3 days felt a raspy cough, rattly, tired state felt normal.  Yesterday felt DOE up stairs and had to sit down. Had GERD when pregnant but better.   A CT scan of her chest was performed to exclude pulmonary embolism. She has no evidence of cardiomegaly on CT scan. No evidence of pulmonary edema. Personally viewed. Reassuring.  EKG performed on 05/01/15 showed sinus rhythm with no other abnormalities.  BNP was normal at 38.  Since yesterday, her cough has become a little bit more productive. Feels some improvement in shortness of breath.  She has a 40 year old daughter and during her pregnancy had typical fatigue. She does not remember feeling the shortness of breath postpartum as she felt yesterday.    Past Medical History  Diagnosis Date  . Anemia     during pregnancy  . Headache   . Hx of varicella   . Asthma   . Depression     hx mild pp depression    Past Surgical History  Procedure Laterality Date  . Cesarean section      emergency  . Wisdom tooth extraction       Current Outpatient Prescriptions  Medication Sig Dispense Refill  . albuterol (PROVENTIL HFA;VENTOLIN HFA) 108 (90 BASE) MCG/ACT inhaler Inhale 2 puffs into the lungs every 4 (four) hours as needed for wheezing or shortness of breath. 1 Inhaler 0  . azithromycin (ZITHROMAX) 250 MG tablet  Take 1 tablet (250 mg total) by mouth daily. Take first 2 tablets together, then 1 every day until finished. Please take if symptoms not improving after two weeks. 6 tablet 0  . FENUGREEK PO Take 1 tablet by mouth 2 (two) times daily.    Marland Kitchen. ibuprofen (ADVIL,MOTRIN) 200 MG tablet Take 400 mg by mouth every 6 (six) hours as needed for moderate pain.    Marland Kitchen. omega-3 acid ethyl esters (LOVAZA) 1 G capsule Take 1 g by mouth 2 (two) times daily.    . Prenatal Vit-Fe Fumarate-FA (PRENATAL MULTIVITAMIN) TABS tablet Take 1 tablet by mouth daily at 12 noon.     No current facility-administered medications for this visit.    Allergies:   Review of patient's allergies indicates no known allergies.    Social History:  The patient  reports that she quit smoking about 8 years ago. Her smoking use included Cigarettes. She has never used smokeless tobacco. She reports that she drinks alcohol. She reports that she does not use illicit drugs.   Family History:  The patient's family history includes Cancer in her maternal grandfather, maternal grandmother, and paternal grandfather; Diabetes in her mother; Heart attack in her father; Hyperlipidemia in her mother; Hypertension in her mother; Other in her child.    ROS:  Please see the history of present illness.   Otherwise, review of systems are positive for as above.  All other systems are reviewed and negative.  Hair stylist. +HA   PHYSICAL EXAM: VS:  BP 108/76 mmHg  Pulse 79  Ht  (1.651 m)  Wt 180 lb (81.647 kg)  BMI 29.95 kg/m2  SpO2 95%  LMP 06/30/2014  Breastfeeding? Yes , BMI Body mass index is 29.95 kg/(m^2). GEN: Well nourished, well developed, in no acute distress HEENT: normal Neck: no JVD, carotid bruits, or masses Cardiac: RRR; no murmurs, rubs, or gallops,no edema  Respiratory:  clear to auscultation bilaterally, normal work of breathing GI: soft, nontender, nondistended, + BS MS: no deformity or atrophy Skin: warm and dry, no  rash Neuro:  Strength and sensation are intact Psych: euthymic mood, full affect   EKG:  EKG is not ordered today. Prior EKG as described above, normal    Recent Labs: 05/01/2015: ALT 22; B Natriuretic Peptide 38.0; BUN 18; Creatinine, Ser 0.61; Hemoglobin 13.1; Platelets 311; Potassium 4.2; Sodium 138    Lipid Panel No results found for: CHOL, TRIG, HDL, CHOLHDL, VLDL, LDLCALC, LDLDIRECT    Wt Readings from Last 3 Encounters:  05/02/15 180 lb (81.647 kg)  05/01/15 190 lb (86.183 kg)  04/23/15 201 lb (91.173 kg)      Other studies Reviewed: Additional studies/ records that were reviewed today include: CT scan personally viewed of chest, negative, lab work, office note reviewed. Review of the above records demonstrates: As above   ASSESSMENT AND PLAN:  1.  Dyspnea postpartum  - Thus far, BNP is reassuring, normal, CT scan of chest is reassuring showing no evidence of pulmonary edema. Physical exam is also reassuring showing no evidence of S3 or signs of cardiomyopathy. Her cardiac contours were normal on CT scan.  - I will check an echocardiogram to ensure that she does not have any evidence of systolic dysfunction or peripartum cardiomyopathy.  - Thus far, her cough is becoming more productive. Perhaps he just developing bronchitis and had some airway restriction because of this. No pneumonia was noted on CT scan.  - She has been prescribed both albuterol as well as azithromycin. I suggested the possibility of loosening as well.    Current medicines are reviewed at length with the patient today.  The patient does not have concerns regarding medicines.  The following changes have been made:  no change  Labs/ tests ordered today include:   Orders Placed This Encounter  Procedures  . Echocardiogram     Disposition:   We will follow-up with results of testing.  Mathews Robinsons, MD  05/02/2015 10:56 AM    [facility] [address] [place],  [place], [place]  [address] Phone: [phone]; Fax: [phone]

## 2015-05-02 NOTE — Patient Instructions (Signed)
Medication Instructions:  None  Labwork: None  Testing/Procedures: Your physician has requested that you have an echocardiogram. Echocardiography is a painless test that uses sound waves to create images of your heart. It provides your doctor with information about the size and shape of your heart and how well your heart's chambers and valves are working. This procedure takes approximately one hour. There are no restrictions for this procedure.   Follow-Up: Your physician recommends that you schedule a follow-up appointment as needed with Dr. Anne FuSkains.  Any Other Special Instructions Will Be Listed Below (If Applicable).

## 2015-05-07 ENCOUNTER — Ambulatory Visit (HOSPITAL_COMMUNITY): Payer: 59 | Attending: Internal Medicine

## 2015-05-07 ENCOUNTER — Other Ambulatory Visit: Payer: Self-pay

## 2015-05-07 DIAGNOSIS — I340 Nonrheumatic mitral (valve) insufficiency: Secondary | ICD-10-CM | POA: Insufficient documentation

## 2015-05-07 DIAGNOSIS — R0602 Shortness of breath: Secondary | ICD-10-CM | POA: Insufficient documentation

## 2015-05-07 DIAGNOSIS — I517 Cardiomegaly: Secondary | ICD-10-CM | POA: Insufficient documentation

## 2016-11-17 ENCOUNTER — Encounter (HOSPITAL_COMMUNITY): Payer: Self-pay | Admitting: *Deleted

## 2016-11-17 ENCOUNTER — Emergency Department (HOSPITAL_COMMUNITY)
Admission: EM | Admit: 2016-11-17 | Discharge: 2016-11-17 | Disposition: A | Discharge status: Home / Self Care | Payer: Commercial Managed Care - HMO | Attending: Emergency Medicine | Admitting: Emergency Medicine

## 2016-11-17 DIAGNOSIS — Y999 Unspecified external cause status: Secondary | ICD-10-CM | POA: Insufficient documentation

## 2016-11-17 DIAGNOSIS — J45909 Unspecified asthma, uncomplicated: Secondary | ICD-10-CM | POA: Diagnosis not present

## 2016-11-17 DIAGNOSIS — W260XXA Contact with knife, initial encounter: Secondary | ICD-10-CM | POA: Diagnosis not present

## 2016-11-17 DIAGNOSIS — S61211A Laceration without foreign body of left index finger without damage to nail, initial encounter: Secondary | ICD-10-CM | POA: Diagnosis present

## 2016-11-17 DIAGNOSIS — Y9389 Activity, other specified: Secondary | ICD-10-CM | POA: Diagnosis not present

## 2016-11-17 DIAGNOSIS — Y929 Unspecified place or not applicable: Secondary | ICD-10-CM | POA: Insufficient documentation

## 2016-11-17 DIAGNOSIS — Z87891 Personal history of nicotine dependence: Secondary | ICD-10-CM | POA: Diagnosis not present

## 2016-11-17 HISTORY — DX: Migraine, unspecified, not intractable, without status migrainosus: G43.909

## 2016-11-17 NOTE — Discharge Instructions (Signed)
Please check on your tetanus status with your primary care provider. It should be updated if it has been greater than 10 years.

## 2016-11-17 NOTE — ED Notes (Signed)
See provider note for assessment

## 2016-11-17 NOTE — ED Provider Notes (Signed)
[facility]-EMERGENCY DEPT Provider Note   CSN: [id number] Arrival date & time: 11/17/16  1908 By signing my name below, I, Bridgette HabermannMaria Tan, attest that this documentation has been prepared under the direction and in the presence of Felicie Mornavid Santiago Graf, FNP. Electronically Signed: Bridgette HabermannMaria Tan, ED Scribe. 11/17/16. 8:57 PM.  History   Chief Complaint Chief Complaint  Patient presents with  . Extremity Laceration    HPI The history is provided by the patient. No language interpreter was used.   HPI Comments: Amber Powell is a 41 y.o. female with no pertinent PMHx, who presents to the Emergency Department complaining of a laceration to second digit on left hand following a mechanical injury occurring just PTA. Pt states she was pitting an avocado when the knife slipped and she accidentally stabbed her left pointer finger. Bleeding was controlled with a bandage. She has not tried any OTC medications PTA. Pt denies fever, chills, or additional injuries. Pt is unsure if her Tdap is UTD.  Past Medical History:  Diagnosis Date  . Anemia    during pregnancy  . Asthma   . Depression    hx mild pp depression  . Headache   . Hx of varicella   . Migraines     Patient Active Problem List   Diagnosis Date Noted  . Pregnancy 04/23/2015    Past Surgical History:  Procedure Laterality Date  . CESAREAN SECTION     emergency  . WISDOM TOOTH EXTRACTION      OB History    Gravida Para Term Preterm AB Living   5 3 2 1 2 2    SAB TAB Ectopic Multiple Live Births   2     0 3       Home Medications    Prior to Admission medications   Medication Sig Start Date End Date Taking? Authorizing Provider  albuterol (PROVENTIL HFA;VENTOLIN HFA) 108 (90 BASE) MCG/ACT inhaler Inhale 2 puffs into the lungs every 4 (four) hours as needed for wheezing or shortness of breath. 05/01/15   Lavera GuiseLiu, Dana Duo, MD  azithromycin (ZITHROMAX) 250 MG tablet Take 1 tablet (250 mg total) by mouth daily. Take first 2 tablets together,  then 1 every day until finished. Please take if symptoms not improving after two weeks. 05/01/15   Lavera GuiseLiu, Dana Duo, MD  FENUGREEK PO Take 1 tablet by mouth 2 (two) times daily.    [provider]  ibuprofen (ADVIL,MOTRIN) 200 MG tablet Take 400 mg by mouth every 6 (six) hours as needed for moderate pain.    [provider]  omega-3 acid ethyl esters (LOVAZA) 1 G capsule Take 1 g by mouth 2 (two) times daily.    [provider]  Prenatal Vit-Fe Fumarate-FA (PRENATAL MULTIVITAMIN) TABS tablet Take 1 tablet by mouth daily at 12 noon.    [provider]    Family History Family History  Problem Relation Age of Onset  . Hypertension Mother   . Diabetes Mother   . Hyperlipidemia Mother   . Cancer Maternal Grandmother     lung  . Cancer Maternal Grandfather     unknown type  . Cancer Paternal Grandfather     lung  . Other Child     trisomy 5718, deceased  . Heart attack Father     Social History Social History  Substance Use Topics  . Smoking status: Former Smoker    Types: Cigarettes    Quit date: 07/14/2006  . Smokeless tobacco: Never Used  . Alcohol  use Yes     Comment: very little, not while preg     Allergies   Patient has no known allergies.   Review of Systems Review of Systems  Constitutional: Negative for chills and fever.  Skin: Positive for wound.  Neurological: Negative for numbness.  All other systems reviewed and are negative.    Physical Exam Updated Vital Signs BP 125/87 (BP Location: Left Arm)   Pulse 77   Temp 99.2 F (37.3 C) (Oral)   Resp 15   Ht 5\' 5"  (1.651 m)   Wt 170 lb (77.1 kg)   LMP 11/11/2016   SpO2 99%   BMI 28.29 kg/m   Physical Exam  Constitutional: She appears well-developed and well-nourished.  HENT:  Head: Normocephalic.  Eyes: Conjunctivae are normal.  Cardiovascular: Normal rate.   Pulmonary/Chest: Effort normal. No respiratory distress.  Abdominal: She exhibits no distension.    Musculoskeletal: Normal range of motion.  1 cm laceration to the medial aspect of the left index finger at the MCP joint.  Neurological: She is alert.  Skin: Skin is warm and dry.  Psychiatric: She has a normal mood and affect. Her behavior is normal.  Nursing note and vitals reviewed.  ED Treatments / Results  DIAGNOSTIC STUDIES: Oxygen Saturation is 99% on RA, normal by my interpretation.   COORDINATION OF CARE: 8:57 PM-Discussed next steps with pt. Pt verbalized understanding and is agreeable with the plan.   Labs (all labs ordered are listed, but only abnormal results are displayed) Labs Reviewed - No data to display  EKG  EKG Interpretation None       Radiology No results found.  Procedures .Marland KitchenLaceration Repair Date/Time: 11/17/2016 8:58 PM Performed by: Katrinka Blazing, Namita Yearwood Authorized by: Katrinka Blazing, Magaly Pollina   Consent:    Consent obtained:  Verbal   Consent given by:  Patient Anesthesia (see MAR for exact dosages):    Anesthesia method:  None Laceration details:    Location:  Finger   Finger location:  L index finger   Length (cm):  1 Repair type:    Repair type:  Simple Pre-procedure details:    Preparation:  Patient was prepped and draped in usual sterile fashion Exploration:    Wound exploration: wound explored through full range of motion and entire depth of wound probed and visualized     Contaminated: no   Treatment:    Area cleansed with:  Betadine   Amount of cleaning:  Standard   Irrigation solution:  Sterile saline   Irrigation method:  Pressure wash   Visualized foreign bodies/material removed: no   Skin repair:    Repair method:  Steri-Strips   Number of Steri-Strips:  1 Approximation:    Approximation:  Close   Vermilion border: well-aligned   Post-procedure details:    Dressing:  Adhesive bandage   Patient tolerance of procedure:  Tolerated well, no immediate complications    Medications Ordered in ED Medications - No data to  display   Initial Impression / Assessment and Plan / ED Course  I have reviewed the triage vital signs and the nursing notes.  Pertinent labs & imaging results that were available during my care of the patient were reviewed by me and considered in my medical decision making (see chart for details).     Tetanus UTD. Laceration occurred < 12 hours prior to repair. Discussed laceration care with pt and answered questions. Pt to f-u for  wound check should there be signs of infection. Pt is  hemodynamically stable with no complaints prior to dc.    Final Clinical Impressions(s) / ED Diagnoses   Final diagnoses:  Laceration of left index finger without foreign body without damage to nail, initial encounter    New Prescriptions New Prescriptions   No medications on file   I personally performed the services described in this documentation, which was scribed in my presence. The recorded information has been reviewed and is accurate.     Felicie Morn, NP 11/18/16 1610    Cathren Laine, MD 11/24/16 [phone]

## 2016-11-17 NOTE — ED Triage Notes (Signed)
Pt was pitting avocado, knife slipped, and she stabbed the pointer finger of her L hand.  No active bleeding.

## 2017-02-25 ENCOUNTER — Encounter: Payer: 59 | Admitting: Vascular Surgery

## 2017-02-27 ENCOUNTER — Encounter: Payer: Self-pay | Admitting: Vascular Surgery

## 2017-03-02 ENCOUNTER — Ambulatory Visit (INDEPENDENT_AMBULATORY_CARE_PROVIDER_SITE_OTHER): Payer: 59 | Admitting: Vascular Surgery

## 2017-03-02 VITALS — BP 114/77 | HR 97 | Temp 98.4°F | Resp 18 | Ht 65.0 in | Wt 171.0 lb

## 2017-03-02 DIAGNOSIS — R2241 Localized swelling, mass and lump, right lower limb: Secondary | ICD-10-CM | POA: Diagnosis not present

## 2017-03-02 DIAGNOSIS — I83893 Varicose veins of bilateral lower extremities with other complications: Secondary | ICD-10-CM | POA: Insufficient documentation

## 2017-03-02 NOTE — Progress Notes (Signed)
Subjective:     Patient ID: Amber Powell, female   DOB: April 14, 1976, 41 y.o.   MRN: [id number]  HPI This 41 year old female was referred by Dr. Jarold Motto for evaluation of bilateral varicose veins with pain. She states that in July she developed a painful "knot" the lower aspect of her right leg above the ankle which occasionally is prominent when she has been standing on her feet which she does much of the day. She has no history of bulging varicose veins, DVT, superficial thrombophlebitis, stasis ulcers, or bleeding. She does not relate to compression stockings. Her symptoms are primarily in the right ankle area.  Past Medical History:  Diagnosis Date  . Anemia    during pregnancy  . Asthma   . Depression    hx mild pp depression  . Headache   . Hx of varicella   . Migraines     Social History  Substance Use Topics  . Smoking status: Former Smoker    Types: Cigarettes    Quit date: 07/14/2006  . Smokeless tobacco: Never Used  . Alcohol use Yes     Comment: very little, not while preg    Family History  Problem Relation Age of Onset  . Hypertension Mother   . Diabetes Mother   . Hyperlipidemia Mother   . Cancer Maternal Grandmother        lung  . Cancer Maternal Grandfather        unknown type  . Cancer Paternal Grandfather        lung  . Other Child        trisomy 30, deceased  . Heart attack Father     Allergies  Allergen Reactions  . No Known Allergies      Current Outpatient Prescriptions:  .  albuterol (PROVENTIL HFA;VENTOLIN HFA) 108 (90 BASE) MCG/ACT inhaler, Inhale 2 puffs into the lungs every 4 (four) hours as needed for wheezing or shortness of breath. (Patient not taking: Reported on 03/02/2017), Disp: 1 Inhaler, Rfl: 0 .  azithromycin (ZITHROMAX) 250 MG tablet, Take 1 tablet (250 mg total) by mouth daily. Take first 2 tablets together, then 1 every day until finished. Please take if symptoms not improving after two weeks. (Patient not taking: Reported on  03/02/2017), Disp: 6 tablet, Rfl: 0 .  FENUGREEK PO, Take 1 tablet by mouth 2 (two) times daily., Disp: , Rfl:  .  ibuprofen (ADVIL,MOTRIN) 200 MG tablet, Take 400 mg by mouth every 6 (six) hours as needed for moderate pain., Disp: , Rfl:  .  omega-3 acid ethyl esters (LOVAZA) 1 G capsule, Take 1 g by mouth 2 (two) times daily., Disp: , Rfl:  .  Prenatal Vit-Fe Fumarate-FA (PRENATAL MULTIVITAMIN) TABS tablet, Take 1 tablet by mouth daily at 12 noon., Disp: , Rfl:   Vitals:   03/02/17 1521  BP: 114/77  Pulse: 97  Resp: 18  Temp: 98.4 F (36.9 C)  SpO2: 98%  Weight: 171 lb (77.6 kg)  Height: 5\' 5"  (1.651 m)    Body mass index is 28.46 kg/m.        Review of Systems Denies chest pain, dyspnea on exertion, PND, orthopnea, hemoptysis, claudication. Does have a history of migraine headaches.    Objective:   Physical Exam BP 114/77 (BP Location: Left Arm, Patient Position: Sitting, Cuff Size: Normal)   Pulse 97   Temp 98.4 F (36.9 C)   Resp 18   Ht 5\' 5"  (1.651 m)   Wt 171 lb (77.6  kg)   SpO2 98%   BMI 28.46 kg/m     Gen.-alert and oriented x3 in no apparent distress HEENT normal for age Lungs no rhonchi or wheezing Cardiovascular regular rhythm no murmurs carotid pulses 3+ palpable no bruits audible Abdomen soft nontender no palpable masses Musculoskeletal free of  major deformities Skin clear -no rashes Neurologic normal Lower extremities 3+ femoral and dorsalis pedis pulses palpable bilaterally with no edema Lower third of right leg proximal to medial malleolus there is palpable nodular area about 2 cm in diameter which at times is need to palpate and other times more difficult to palpate. She has no stigmata of venous disease including telangiectasia bulging varicosities reticular veins or hyperpigmentation. No abnormalities in the left lower extremity. No edema in either lower extremity.  Today I performed a bedside SonoSite ultrasound exam which reveals a lateral  great saphenous veins to be totally normal in size no reflux noted. The above mentioned "lump" was visualized clearly separate from the great saphenous vein which was normal in caliber near the ankle. It was not compressible and mild leak tender to compression  \     Assessment:     Small mass-2 cm in diameter-located lower third right lower extremity adjacent to great saphenous vein which appears to be distinctly separate from venous system Could represent thrombosed varicose veins since symptoms began about 4 weeks ago     Plan:     Would not recommend any intervention or treatment as I think this will resolve and eventually become asymptomatic If it persists or becomes increasingly painful eye suggested that she discuss this with her medical doctor and possibly see a general surgeon as I do not feel this is related to her venous system If this enlarges she may need an MRI scan to see what the etiology might be She understands this and agrees with following this with symptomatic treatment including ibuprofen and/or heat and observation Return to see me on a when necessary basis

## 2017-04-11 IMAGING — CT CT ANGIO CHEST
2 of 6 series · 19 of 36 positions shown · IV contrast (Omni 300)
Comparison: Radiographs earlier today.

CLINICAL DATA: Shortness of breath and dyspnea on exertion, recent
pregnancy. Productive cough, chest pressure. Symptoms for 3 days.

EXAM:
CT ANGIOGRAPHY CHEST WITH CONTRAST
TECHNIQUE: Multidetector CT imaging of the chest was performed using the
standard protocol during bolus administration of intravenous
contrast. Multiplanar CT image reconstructions and MIPs were
obtained to evaluate the vascular anatomy.
CONTRAST:  70mL OMNIPAQUE IOHEXOL 350 MG/ML SOLN

[Series 6: pe thins · axial · 0.82mm/px · z∈[+1160,+1398]mm · 18 of 529 slices shown]
[im 26/529  lung]
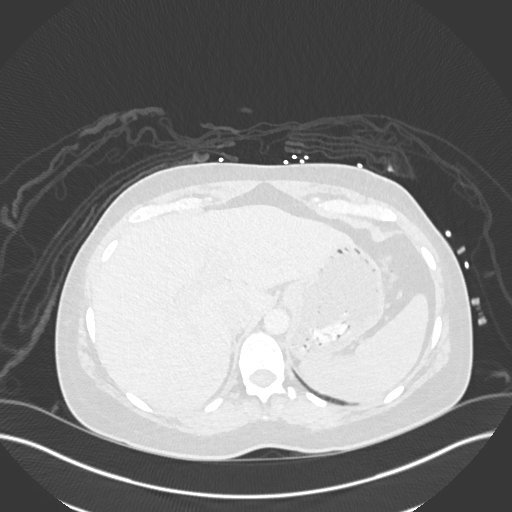
[im 51/529  mediastinal]
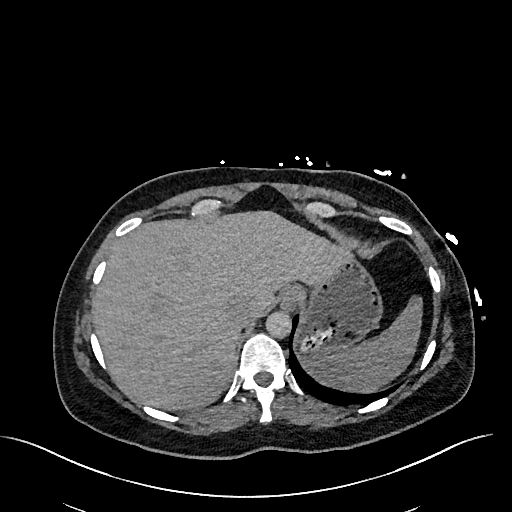
[im 76/529  lung]
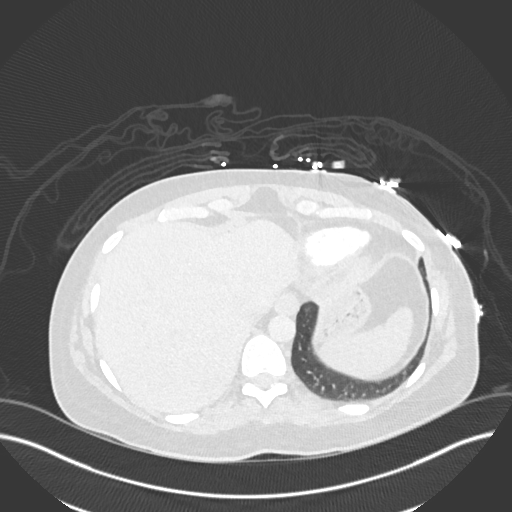
[im 101/529  mediastinal]
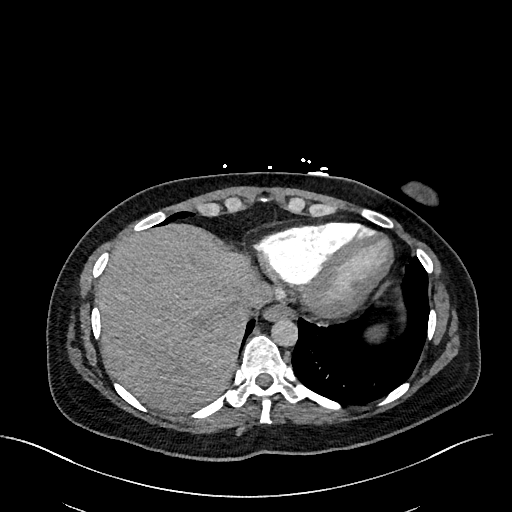
[im 151/529  lung]
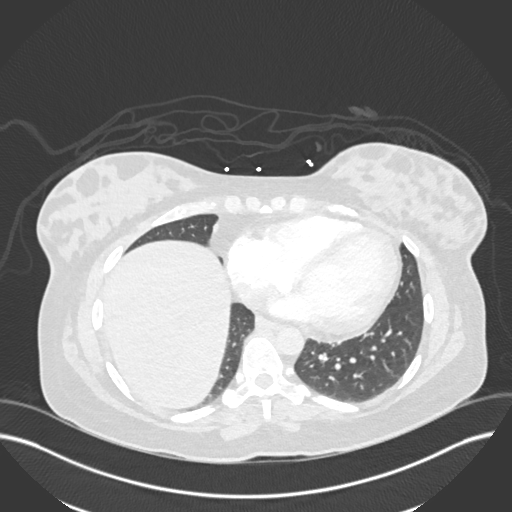
[im 177/529  mediastinal]
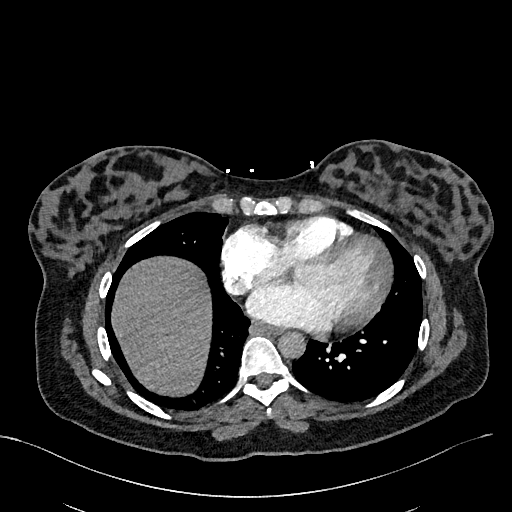
[im 202/529  lung]
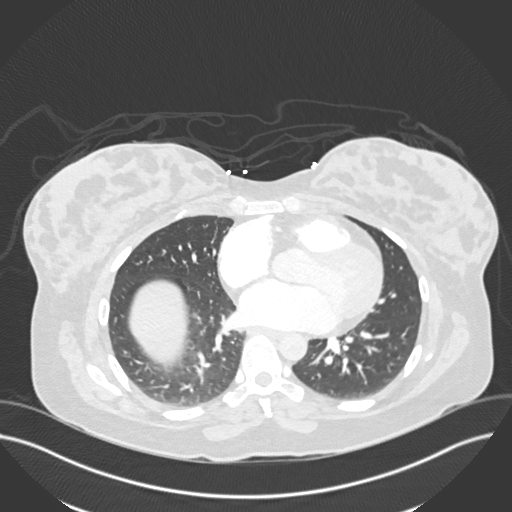
[im 227/529  mediastinal]
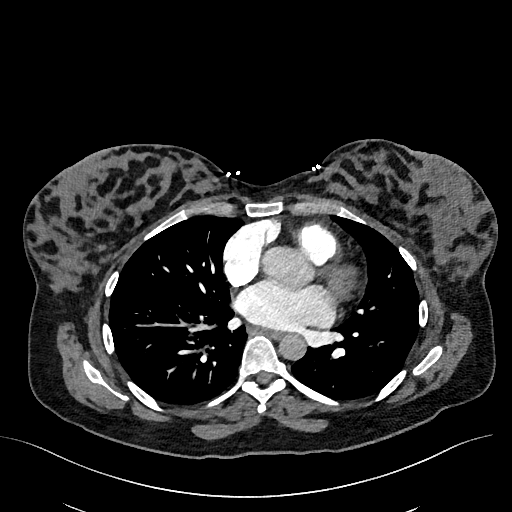
[im 252/529  lung]
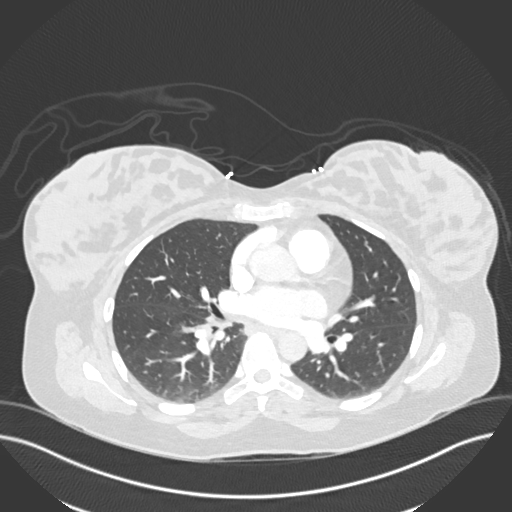
[im 277/529  mediastinal]
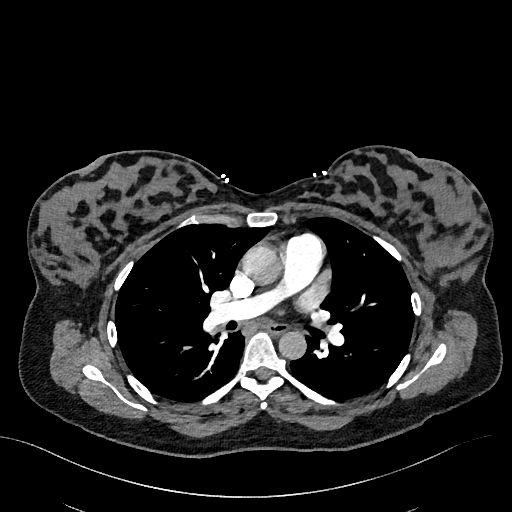
[im 302/529  lung]
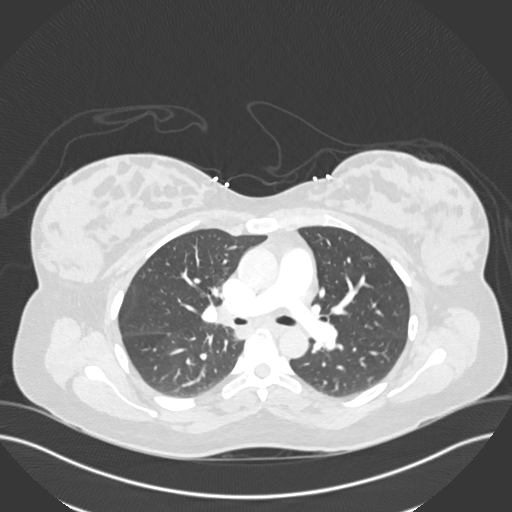
[im 327/529  mediastinal]
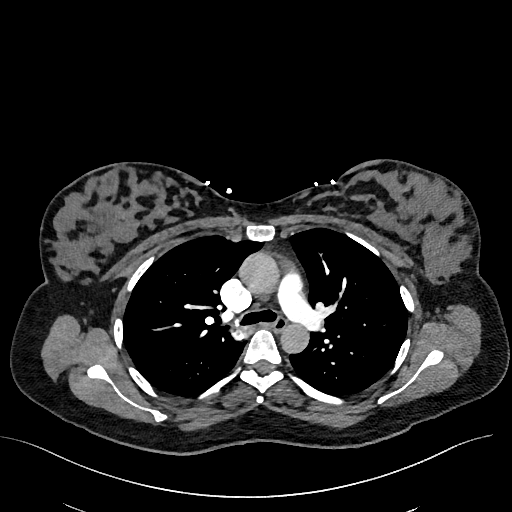
[im 353/529  lung]
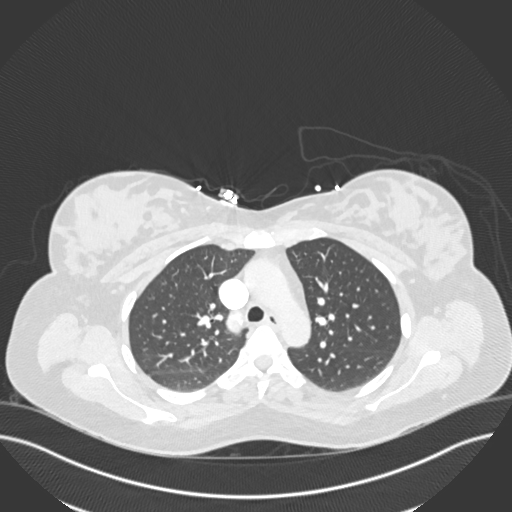
[im 403/529  mediastinal]
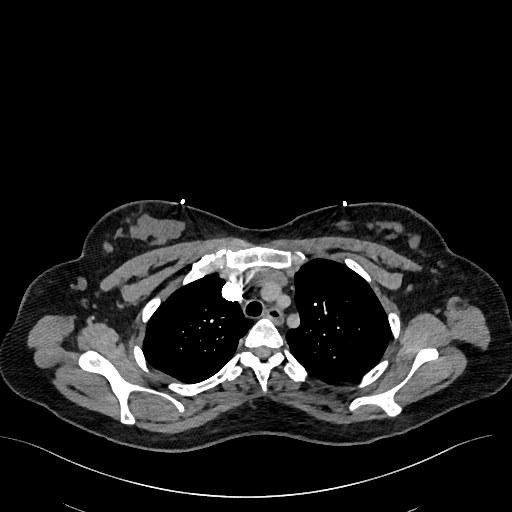
[im 428/529  lung]
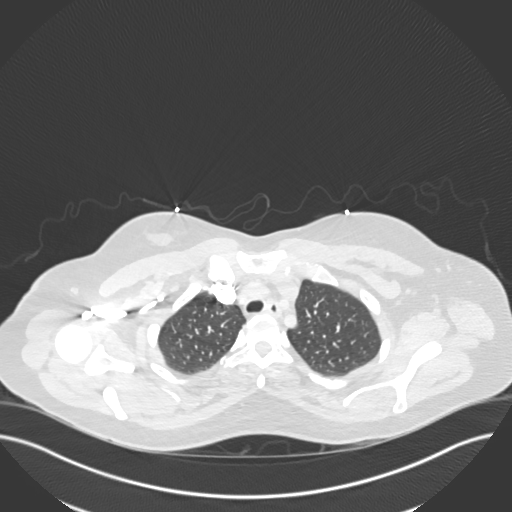
[im 453/529  mediastinal]
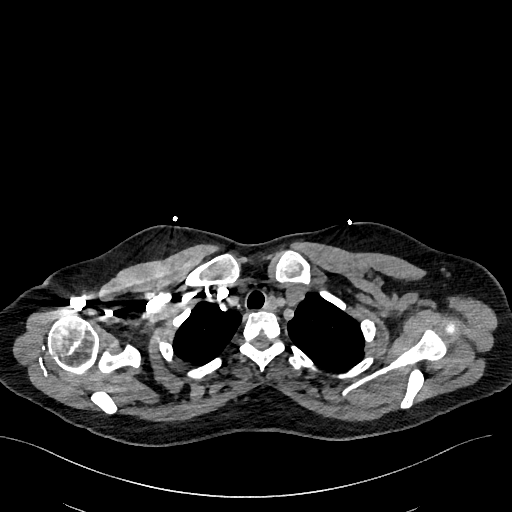
[im 478/529  lung]
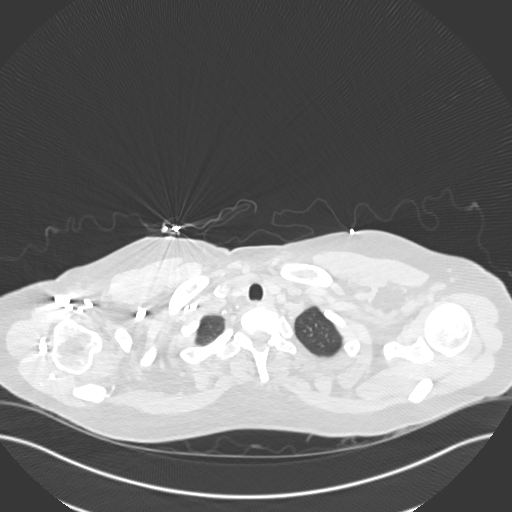
[im 503/529  mediastinal]
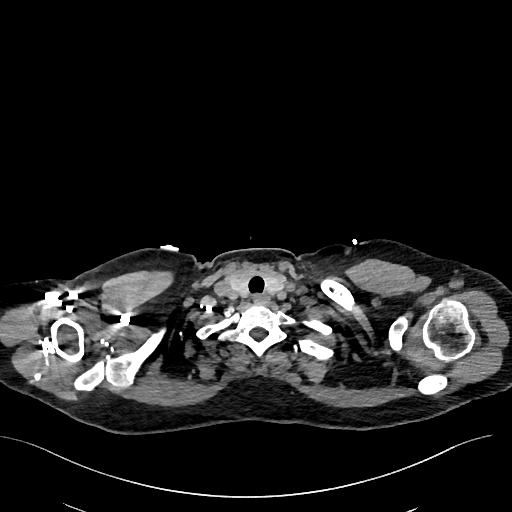

[Series 7: pe 2mm cor · coronal · 0.58mm/px · 1 of 120 slices shown]
[im 60/120  mediastinal]
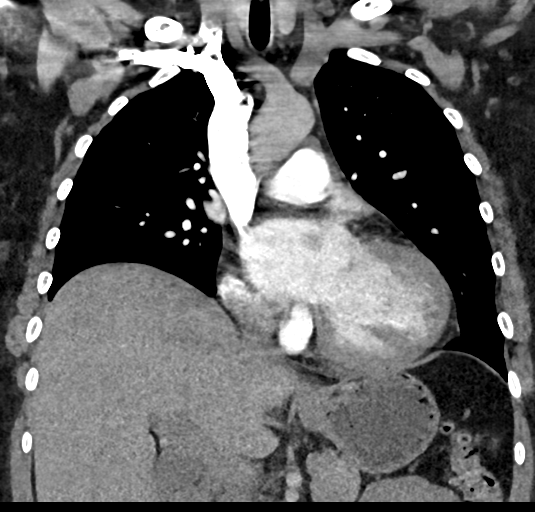

[19 of 36 positions shown; findings below may reference images not displayed]

FINDINGS: There are no filling defects within the pulmonary arteries to
suggest pulmonary embolus.

The thoracic aorta is normal in caliber, there is a conventional
branching pattern from the aortic arch. Heart size is normal. No
mediastinal, hilar or axillary adenopathy. No pleural or pericardial
effusion.

Diffuse bronchial thickening in the right lower lobe. The lungs are
clear without consolidation, findings of pulmonary edema, nodule or
mass.

The included upper abdomen is normal. The esophagus is decompressed.
The visualized thyroid gland appears normal.

There are no acute or suspicious osseous abnormalities.

Review of the MIP images confirms the above findings.
IMPRESSION: 1. No pulmonary embolus.
2. Bronchial thickening of the right lower lobe lobar and segmental
branches, suggestive of acute bronchitis. No consolidation.

## 2017-05-12 ENCOUNTER — Encounter: Payer: 59 | Admitting: Vascular Surgery

## 2020-04-09 ENCOUNTER — Encounter (HOSPITAL_BASED_OUTPATIENT_CLINIC_OR_DEPARTMENT_OTHER): Payer: Self-pay | Admitting: *Deleted

## 2020-04-09 ENCOUNTER — Other Ambulatory Visit: Payer: Self-pay

## 2020-04-09 ENCOUNTER — Emergency Department (HOSPITAL_BASED_OUTPATIENT_CLINIC_OR_DEPARTMENT_OTHER)
Admission: EM | Admit: 2020-04-09 | Discharge: 2020-04-09 | Disposition: A | Discharge status: Home / Self Care | Payer: Managed Care, Other (non HMO) | Attending: Emergency Medicine | Admitting: Emergency Medicine

## 2020-04-09 ENCOUNTER — Emergency Department (HOSPITAL_BASED_OUTPATIENT_CLINIC_OR_DEPARTMENT_OTHER): Payer: Managed Care, Other (non HMO)

## 2020-04-09 DIAGNOSIS — Z20822 Contact with and (suspected) exposure to covid-19: Secondary | ICD-10-CM | POA: Diagnosis not present

## 2020-04-09 DIAGNOSIS — J45909 Unspecified asthma, uncomplicated: Secondary | ICD-10-CM | POA: Diagnosis not present

## 2020-04-09 DIAGNOSIS — Z87891 Personal history of nicotine dependence: Secondary | ICD-10-CM | POA: Diagnosis not present

## 2020-04-09 DIAGNOSIS — R0602 Shortness of breath: Secondary | ICD-10-CM | POA: Diagnosis present

## 2020-04-09 LAB — CBC WITH DIFFERENTIAL/PLATELET
Abs Immature Granulocytes: 0.03 10*3/uL (ref 0.00–0.07)
Basophils Absolute: 0 10*3/uL (ref 0.0–0.1)
Basophils Relative: 0 %
Eosinophils Absolute: 0 10*3/uL (ref 0.0–0.5)
Eosinophils Relative: 0 %
HCT: 44.1 % (ref 36.0–46.0)
Hemoglobin: 14.4 g/dL (ref 12.0–15.0)
Immature Granulocytes: 0 %
Lymphocytes Relative: 13 %
Lymphs Abs: 1.3 10*3/uL (ref 0.7–4.0)
MCH: 27.7 pg (ref 26.0–34.0)
MCHC: 32.7 g/dL (ref 30.0–36.0)
MCV: 85 fL (ref 80.0–100.0)
Monocytes Absolute: 0.7 10*3/uL (ref 0.1–1.0)
Monocytes Relative: 7 %
Neutro Abs: 7.5 10*3/uL (ref 1.7–7.7)
Neutrophils Relative %: 80 %
Platelets: 276 10*3/uL (ref 150–400)
RBC: 5.19 MIL/uL — ABNORMAL HIGH (ref 3.87–5.11)
RDW: 12.5 % (ref 11.5–15.5)
WBC: 9.4 10*3/uL (ref 4.0–10.5)
nRBC: 0 % (ref 0.0–0.2)

## 2020-04-09 LAB — BASIC METABOLIC PANEL
Anion gap: 11 (ref 5–15)
BUN: 7 mg/dL (ref 6–20)
CO2: 27 mmol/L (ref 22–32)
Calcium: 9.1 mg/dL (ref 8.9–10.3)
Chloride: 99 mmol/L (ref 98–111)
Creatinine, Ser: 0.49 mg/dL (ref 0.44–1.00)
GFR calc Af Amer: >60 mL/min (ref >60)
GFR calc non Af Amer: >60 mL/min (ref >60)
Glucose, Bld: 91 mg/dL (ref 70–99)
Potassium: 4.1 mmol/L (ref 3.5–5.1)
Sodium: 137 mmol/L (ref 135–145)

## 2020-04-09 LAB — TROPONIN I (HIGH SENSITIVITY): Troponin I (High Sensitivity): 3 ng/L (ref <18)

## 2020-04-09 LAB — RESPIRATORY PANEL BY RT PCR (FLU A&B, COVID)
Influenza A by PCR: NEGATIVE
Influenza B by PCR: NEGATIVE
SARS Coronavirus 2 by RT PCR: NEGATIVE

## 2020-04-09 MED ORDER — HYDROXYZINE HCL 25 MG PO TABS: 25.0000 mg | ORAL_TABLET | Freq: Four times a day (QID) | ORAL | 0 refills | Status: DC | PRN

## 2020-04-09 MED ORDER — LORAZEPAM 2 MG/ML IJ SOLN
1.0000 mg | Freq: Once | INTRAMUSCULAR | Status: AC
  Administered 2020-04-09: 1 mg via INTRAVENOUS
  Filled 2020-04-09: qty 1

## 2020-04-09 MED ORDER — DEXAMETHASONE SODIUM PHOSPHATE 10 MG/ML IJ SOLN
10.0000 mg | Freq: Once | INTRAMUSCULAR | Status: AC
  Administered 2020-04-09: 10 mg via INTRAVENOUS
  Filled 2020-04-09: qty 1

## 2020-04-09 MED ORDER — ALBUTEROL SULFATE HFA 108 (90 BASE) MCG/ACT IN AERS
2.0000 | INHALATION_SPRAY | Freq: Once | RESPIRATORY_TRACT | Status: AC
  Administered 2020-04-09: 2 via RESPIRATORY_TRACT
  Filled 2020-04-09: qty 6.7

## 2020-04-09 NOTE — ED Triage Notes (Signed)
Shortness of breath x 5 days.  Daughter was tested + for Covid on the 13th of September and pt was exposed to her daughter.

## 2020-04-09 NOTE — ED Notes (Signed)
Unvaccinated  Pt states started to feel bad last wed after her daughter tested positive ,  Feels sob and weak ,  Has had a cough

## 2020-04-09 NOTE — Discharge Instructions (Signed)
Your labs been reassuring today.  No signs of a heart attack, pneumonia, blood clot, COVID-19.  Unknown cause of your symptoms.  As we discussed it may be secondary to anxiety.  I will give you a short course of some medication to take at home at night that can help with anxiety.  I would also have you follow-up with your primary care doctor as you may need further medication management.  If you develop any worsening symptoms they will return to the ER.

## 2020-04-09 NOTE — ED Notes (Signed)
Ambulated from lobby to triage 1, HR 88, SpO2 99% on r/a, no DOE noted.

## 2020-04-09 NOTE — ED Provider Notes (Signed)
[facility] EMERGENCY DEPARTMENT Provider Note   CSN: [id number] Arrival date & time: 04/09/20  6811     History Chief Complaint  Patient presents with  . Shortness of Breath    Amber Powell is a 44 y.o. female.  HPI 44 year old female with no pertinent past medical history presents to the emergency department today for evaluation of shortness of breath.  Patient reports "I cannot breathe".  Patient reports symptoms have been ongoing for the past week.  She reports that her 2 daughters were positive for COVID-19.  Patient has not been tested for COVID-19.  Patient denies any pleuritic chest pain.  She reports that it just is hard to take a deep breath.  Patient denies any productive cough.  Has poor to minimal nonproductive cough.  Patient reports having fever and chills last week.  She has taken over-the-counter medications for symptoms with little relief.  Patient denies any history of PE/DVT, prolonged immobilization, recent hospitalization/surgeries, unilateral leg swelling or calf tenderness, tobacco use, OCP use.  No cardiac history.    Past Medical History:  Diagnosis Date  . Anemia    during pregnancy  . Asthma   . Depression    hx mild pp depression  . Headache   . Hx of varicella   . Migraines     Patient Active Problem List   Diagnosis Date Noted  . Varicose veins of bilateral lower extremities with other complications 03/02/2017  . Localized swelling, mass, or lump of lower extremity, right 03/02/2017  . Pregnancy 04/23/2015    Past Surgical History:  Procedure Laterality Date  . CESAREAN SECTION     emergency  . WISDOM TOOTH EXTRACTION       OB History    Gravida  5   Para  3   Term  2   Preterm  1   AB  2   Living  2     SAB  2   TAB      Ectopic      Multiple  0   Live Births  3           Family History  Problem Relation Age of Onset  . Hypertension Mother   . Diabetes Mother   . Hyperlipidemia Mother   .  Cancer Maternal Grandmother        lung  . Cancer Maternal Grandfather        unknown type  . Cancer Paternal Grandfather        lung  . Other Child        trisomy 60, deceased  . Heart attack Father     Social History   Tobacco Use  . Smoking status: Former Smoker    Types: Cigarettes    Quit date: 07/14/2006    Years since quitting: 13.7  . Smokeless tobacco: Never Used  Substance Use Topics  . Alcohol use: Yes    Comment: very little, not while preg  . Drug use: No    Home Medications Prior to Admission medications   Medication Sig Start Date End Date Taking? Authorizing Provider  albuterol (PROVENTIL HFA;VENTOLIN HFA) 108 (90 BASE) MCG/ACT inhaler Inhale 2 puffs into the lungs every 4 (four) hours as needed for wheezing or shortness of breath. Patient not taking: Reported on 03/02/2017 05/01/15   Lavera Guise, MD  azithromycin (ZITHROMAX) 250 MG tablet Take 1 tablet (250 mg total) by mouth daily. Take first 2 tablets together, then 1 every day until  finished. Please take if symptoms not improving after two weeks. Patient not taking: Reported on 03/02/2017 05/01/15   Lavera Guise, MD  FENUGREEK PO Take 1 tablet by mouth 2 (two) times daily.    [provider]  ibuprofen (ADVIL,MOTRIN) 200 MG tablet Take 400 mg by mouth every 6 (six) hours as needed for moderate pain.    [provider]  omega-3 acid ethyl esters (LOVAZA) 1 G capsule Take 1 g by mouth 2 (two) times daily.    [provider]  Prenatal Vit-Fe Fumarate-FA (PRENATAL MULTIVITAMIN) TABS tablet Take 1 tablet by mouth daily at 12 noon.    [provider]    Allergies    No known allergies  Review of Systems   Review of Systems  Constitutional: Positive for chills and fever (resolved).  HENT: Positive for congestion. Negative for sore throat.   Respiratory: Positive for cough and shortness of breath.   Cardiovascular: Negative for chest pain, palpitations and leg swelling.    Gastrointestinal: Negative for abdominal pain, diarrhea, nausea and vomiting.  Genitourinary: Negative for difficulty urinating.  Musculoskeletal: Negative for myalgias.  Skin: Negative for color change.  Neurological: Negative for syncope.  Psychiatric/Behavioral: Negative for confusion.    Physical Exam Updated Vital Signs BP 124/83 (BP Location: Right Arm)   Pulse 71   Temp 98.3 F (36.8 C) (Oral)   Resp 18   Ht 5\' 5"  (1.651 m)   Wt 70 kg   LMP 03/19/2020   SpO2 96%   BMI 25.68 kg/m   Physical Exam Vitals and nursing note reviewed.  Constitutional:      General: She is not in acute distress.    Appearance: She is well-developed. She is not ill-appearing, toxic-appearing or diaphoretic.  HENT:     Head: Normocephalic and atraumatic.     Nose: Nose normal.     Mouth/Throat:     Mouth: Mucous membranes are moist.     Pharynx: Oropharynx is clear.  Eyes:     General:        Right eye: No discharge.        Left eye: No discharge.     Conjunctiva/sclera: Conjunctivae normal.     Pupils: Pupils are equal, round, and reactive to light.  Neck:     Vascular: No JVD.     Trachea: No tracheal deviation.  Cardiovascular:     Rate and Rhythm: Normal rate and regular rhythm.     Pulses: Normal pulses.     Heart sounds: Normal heart sounds. No murmur heard.  No friction rub. No gallop.   Pulmonary:     Effort: Pulmonary effort is normal. No respiratory distress.     Breath sounds: Decreased breath sounds present. No wheezing, rhonchi or rales.  Chest:     Chest wall: No tenderness.  Abdominal:     General: Bowel sounds are normal. There is no distension.     Palpations: Abdomen is soft.     Tenderness: There is no abdominal tenderness. There is no guarding or rebound.  Musculoskeletal:        General: Normal range of motion.     Cervical back: Normal range of motion and neck supple.     Right lower leg: No tenderness. No edema.     Left lower leg: No edema.      Comments: No lower extremity edema or calf tenderness.  Lymphadenopathy:     Cervical: No cervical adenopathy.  Skin:  General: Skin is warm and dry.     Capillary Refill: Capillary refill takes less than 2 seconds.  Neurological:     Mental Status: She is alert and oriented to person, place, and time.  Psychiatric:        Mood and Affect: Mood normal.        Behavior: Behavior normal.        Thought Content: Thought content normal.        Judgment: Judgment normal.     ED Results / Procedures / Treatments   Labs (all labs ordered are listed, but only abnormal results are displayed) Labs Reviewed  CBC WITH DIFFERENTIAL/PLATELET - Abnormal; Notable for the following components:      Result Value   RBC 5.19 (*)    All other components within normal limits  RESPIRATORY PANEL BY RT PCR (FLU A&B, COVID)  BASIC METABOLIC PANEL  TROPONIN I (HIGH SENSITIVITY)  TROPONIN I (HIGH SENSITIVITY)    EKG EKG Interpretation  Date/Time:  Monday April 09 2020 10:55:03 EDT Ventricular Rate:  82 PR Interval:    QRS Duration: 98 QT Interval:  394 QTC Calculation: 461 R Axis:   72 Text Interpretation: Sinus rhythm Borderline T abnormalities, lateral leads Baseline wander in lead(s) V6 No STEMI Confirmed by Alona Bene [phone]) on 04/09/2020 12:00:06 PM   Radiology DG Chest Portable 1 View  Result Date: 04/09/2020 CLINICAL DATA:  Cough, shortness of breath.  COVID-19 exposure. EXAM: PORTABLE CHEST 1 VIEW COMPARISON:  05/01/2015 FINDINGS: The heart size and mediastinal contours are within normal limits. No focal airspace consolidation, pleural effusion, or pneumothorax. The visualized skeletal structures are unremarkable. IMPRESSION: No active disease. Electronically Signed   By: Duanne Guess D.O.   On: 04/09/2020 11:12    Procedures Procedures (including critical care time)  Medications Ordered in ED Medications  albuterol (VENTOLIN HFA) 108 (90 Base) MCG/ACT inhaler 2 puff  (has no administration in time range)  dexamethasone (DECADRON) injection 10 mg (has no administration in time range)    ED Course  I have reviewed the triage vital signs and the nursing notes.  Pertinent labs & imaging results that were available during my care of the patient were reviewed by me and considered in my medical decision making (see chart for details).    MDM Rules/Calculators/A&P                          44 year old female presents the ER for evaluation of shortness of breath.  Patient reports that her 2 daughters about COVID-19 over the past week.  Her symptoms have been ongoing for the past week.  Denies chest pain.  Reports that her cough, fevers and chills have resolved.  On exam vital signs are reassuring.  Patient is satting at 98% on room air.  There is no tachypnea, tachycardia noted.  Patient is afebrile.  Ambulatory saturations were 98% on room air.  Patient's lungs are clear to auscultation.  She is taking a deep breath at times.  No lower extremity swelling.  Neurovascularly intact in all extremities.  Patient's presentation could be consistent with pleurisy, viral pneumonia, bacterial pneumonia, anxiety.  Patient's presentation seems less likely consistent with ACS, PE or dissection.  Patient is PERC negative and low risk Wells.  Patient's labs were reviewed.  No leukocytosis.  Normal hemoglobin.  No significant electrolyte derangement.  Troponin was negative.  COVID and flu test were negative.  Patient's EKG was reviewed that  shows sinus rhythm with some nonspecific T wave changes likely wandering baseline but no other reciprocal changes to indicate acute ST elevation depression consistent with MI at this time.  There is no worrisome arrhythmias.  Patient's QT, QRS and PR interval are normal.  X-ray was obtained that shows no signs of pneumonia or any other acute abnormality.  No signs of pulmonary edema.  Patient treated with steroids and inhaler in the ER given her prior  history of asthma.  On reexamination she reports that she feels like her symptoms have not improved.  She reports still not being able to take a deep breath with some shortness of breath.  Continues to deny chest pain.  Her vital signs do remain reassuring.  Patient upon further questioning reports that she is under a lot of stress at this time.  Patient reports that she is going through a difficult divorce.  She is out of work for the past 3 weeks with her daughters with COVID-19.  Patient reports that this could possibly be anxiety.  She has been treated for anxiety in the past.  She is not currently on any medications for anxiety.  Patient given Ativan in the ER.  She was reassessed after about 30 minutes.  Patient seemed to be breathing better.  When I asked her if she was feeling any improvement she states that "I do not know but may be a little bit".  Again patient does not seem to be having any increased work of breathing at this time.  Vital signs remained reassuring.  Unknown etiology of patient's symptoms.  I doubt ACS given that EKG is reassuring and troponin was negative.  Patient is PERC negative and low suspicion for PE.  Doubt myocarditis, pericarditis or endocarditis.  Given that she is under a lot of stress this could be anxiety driven.  Patient given prescription for Atarax.  Patient will need PCP follow-up for further management if needed.  Discussed with patient that she can return to the ER if she develops any worsening symptoms.  Pt is hemodynamically stable, in NAD, & able to ambulate in the ED. Evaluation does not show pathology that would require ongoing emergent intervention or inpatient treatment. I explained the diagnosis to the patient. Pt has no complaints prior to dc. Pt is comfortable with above plan and is stable for discharge at this time. All questions were answered prior to disposition. Strict return precautions for f/u to the ED were discussed. Encouraged follow up with  PCP.    Final Clinical Impression(s) / ED Diagnoses Final diagnoses:  SOB (shortness of breath)    Rx / DC Orders ED Discharge Orders         Ordered    hydrOXYzine (ATARAX/VISTARIL) 25 MG tablet  Every 6 hours PRN        04/09/20 1254           Rise MuLeaphart, Marc Sivertsen T, PA-C 04/09/20 1307    Long, Arlyss RepressJoshua G, MD 04/10/20 [id number]

## 2022-03-21 IMAGING — DX DG CHEST 1V PORT
1 series · 1 of 1 positions shown · non-contrast
Comparison: 05/01/2015

CLINICAL DATA: Cough, shortness of breath.  8NT3U-DN exposure.

EXAM:
PORTABLE CHEST 1 VIEW

[chest ap]
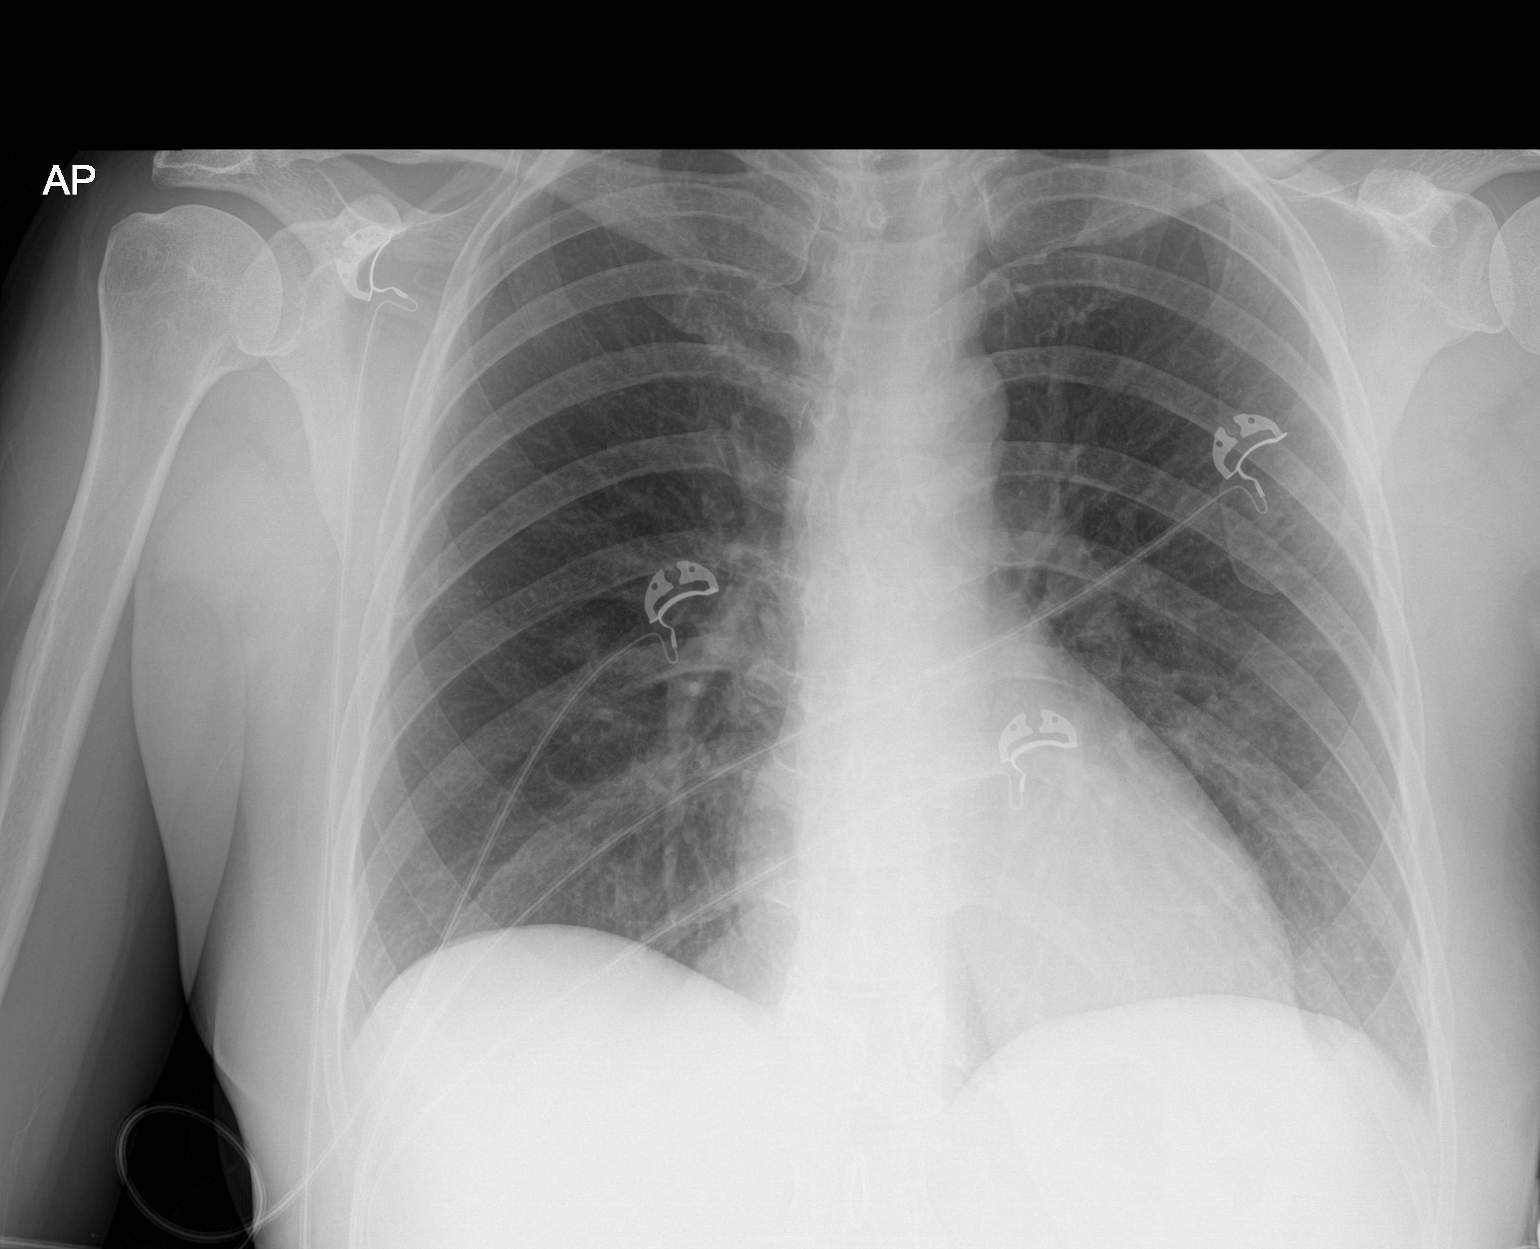

[1 of 1 positions shown; findings below may reference images not displayed]

FINDINGS: The heart size and mediastinal contours are within normal limits. No
focal airspace consolidation, pleural effusion, or pneumothorax. The
visualized skeletal structures are unremarkable.
IMPRESSION: No active disease.

## 2023-06-17 DIAGNOSIS — Z131 Encounter for screening for diabetes mellitus: Secondary | ICD-10-CM | POA: Diagnosis not present

## 2023-06-17 DIAGNOSIS — Z13220 Encounter for screening for lipoid disorders: Secondary | ICD-10-CM | POA: Diagnosis not present

## 2023-06-17 DIAGNOSIS — Z13228 Encounter for screening for other metabolic disorders: Secondary | ICD-10-CM | POA: Diagnosis not present

## 2023-06-17 DIAGNOSIS — Z1329 Encounter for screening for other suspected endocrine disorder: Secondary | ICD-10-CM | POA: Diagnosis not present

## 2023-06-17 DIAGNOSIS — Z01419 Encounter for gynecological examination (general) (routine) without abnormal findings: Secondary | ICD-10-CM | POA: Diagnosis not present

## 2023-06-17 DIAGNOSIS — Z130 Encounter for screening for diseases of the blood and blood-forming organs and certain disorders involving the immune mechanism: Secondary | ICD-10-CM | POA: Diagnosis not present

## 2023-06-17 DIAGNOSIS — Z6827 Body mass index (BMI) 27.0-27.9, adult: Secondary | ICD-10-CM | POA: Diagnosis not present

## 2023-06-17 DIAGNOSIS — R319 Hematuria, unspecified: Secondary | ICD-10-CM | POA: Diagnosis not present

## 2023-06-29 DIAGNOSIS — Z1231 Encounter for screening mammogram for malignant neoplasm of breast: Secondary | ICD-10-CM | POA: Diagnosis not present

## 2023-07-09 ENCOUNTER — Other Ambulatory Visit: Payer: Self-pay | Admitting: Obstetrics & Gynecology

## 2023-07-09 DIAGNOSIS — R928 Other abnormal and inconclusive findings on diagnostic imaging of breast: Secondary | ICD-10-CM

## 2023-07-16 ENCOUNTER — Ambulatory Visit
Admission: RE | Admit: 2023-07-16 | Discharge: 2023-07-16 | Disposition: A | Discharge status: Home / Self Care | Payer: Managed Care, Other (non HMO) | Source: Ambulatory Visit | Attending: Obstetrics & Gynecology | Admitting: Obstetrics & Gynecology

## 2023-07-16 ENCOUNTER — Other Ambulatory Visit: Payer: Self-pay | Admitting: Obstetrics & Gynecology

## 2023-07-16 ENCOUNTER — Ambulatory Visit
Admission: RE | Admit: 2023-07-16 | Discharge: 2023-07-16 | Disposition: A | Discharge status: Home / Self Care | Payer: 59 | Source: Ambulatory Visit | Attending: Obstetrics & Gynecology | Admitting: Obstetrics & Gynecology

## 2023-07-16 DIAGNOSIS — N63 Unspecified lump in unspecified breast: Secondary | ICD-10-CM

## 2023-07-16 DIAGNOSIS — R921 Mammographic calcification found on diagnostic imaging of breast: Secondary | ICD-10-CM

## 2023-07-16 DIAGNOSIS — R928 Other abnormal and inconclusive findings on diagnostic imaging of breast: Secondary | ICD-10-CM

## 2023-07-16 DIAGNOSIS — N6325 Unspecified lump in the left breast, overlapping quadrants: Secondary | ICD-10-CM | POA: Diagnosis not present

## 2023-07-17 ENCOUNTER — Other Ambulatory Visit: Payer: 59

## 2023-07-20 ENCOUNTER — Other Ambulatory Visit: Payer: 59

## 2023-07-23 ENCOUNTER — Ambulatory Visit
Admission: RE | Admit: 2023-07-23 | Discharge: 2023-07-23 | Disposition: A | Discharge status: Home / Self Care | Payer: 59 | Source: Ambulatory Visit | Attending: Obstetrics & Gynecology | Admitting: Obstetrics & Gynecology

## 2023-07-23 DIAGNOSIS — N63 Unspecified lump in unspecified breast: Secondary | ICD-10-CM

## 2023-07-23 DIAGNOSIS — R921 Mammographic calcification found on diagnostic imaging of breast: Secondary | ICD-10-CM

## 2023-07-23 DIAGNOSIS — N6012 Diffuse cystic mastopathy of left breast: Secondary | ICD-10-CM | POA: Diagnosis not present

## 2023-07-23 DIAGNOSIS — D242 Benign neoplasm of left breast: Secondary | ICD-10-CM | POA: Diagnosis not present

## 2023-07-23 DIAGNOSIS — N6325 Unspecified lump in the left breast, overlapping quadrants: Secondary | ICD-10-CM | POA: Diagnosis not present

## 2023-07-23 DIAGNOSIS — N6321 Unspecified lump in the left breast, upper outer quadrant: Secondary | ICD-10-CM | POA: Diagnosis not present

## 2023-07-23 DIAGNOSIS — N6022 Fibroadenosis of left breast: Secondary | ICD-10-CM | POA: Diagnosis not present

## 2023-07-23 HISTORY — PX: BREAST BIOPSY: SHX20

## 2023-07-24 LAB — SURGICAL PATHOLOGY

## 2023-09-18 ENCOUNTER — Encounter: Payer: Self-pay | Admitting: Pediatrics

## 2023-11-02 ENCOUNTER — Ambulatory Visit (AMBULATORY_SURGERY_CENTER): Admitting: *Deleted

## 2023-11-02 VITALS — Ht 65.0 in | Wt 160.0 lb

## 2023-11-02 DIAGNOSIS — Z1211 Encounter for screening for malignant neoplasm of colon: Secondary | ICD-10-CM

## 2023-11-02 MED ORDER — NA SULFATE-K SULFATE-MG SULF 17.5-3.13-1.6 GM/177ML PO SOLN: 1.0000 | Freq: Once | ORAL | 0 refills | Status: AC

## 2023-11-02 NOTE — Progress Notes (Signed)
 Pre visit completed over telephone. Instructions forwarded through MyChart and mailed. Patient mentioned non-specific , intermittent, bilateral low back pain, never sought medical opinion.  Discussed potential causes, patient my seek out sports medicine for evaluation.  No egg or soy allergy known to patient  No issues known to pt with past sedation with any surgeries or procedures Patient denies ever being told they had issues or difficulty with intubation  No FH of Malignant Hyperthermia Pt is not on diet pills Pt is not on  home 02  Pt is not on blood thinners  Pt denies issues with constipation  No A fib or A flutter Have any cardiac testing pending--NO Pt instructed to use [email or web address] or GoodRx for a price reduction on prep

## 2023-11-09 ENCOUNTER — Encounter: Payer: Self-pay | Admitting: Pediatrics

## 2023-11-18 NOTE — Progress Notes (Unsigned)
 [facility] History and Physical   Primary Care Physician:  Bertha Broad, MD   Reason for Procedure:  Colorectal cancer screening  Plan:    Screening colonoscopy     HPI: Amber Powell is a 48 y.o. female undergoing screening colonoscopy for colorectal cancer screening.  This is the patient's first colonoscopy.  No family history of colorectal cancer or polyps.  Patient denies current symptoms of change in bowel habits or rectal bleeding.   Past Medical History:  Diagnosis Date   Anemia    during pregnancy   Asthma    Depression    hx mild pp depression   Headache    Hx of varicella    Migraines     Past Surgical History:  Procedure Laterality Date   BREAST BIOPSY Left 07/23/2023   MM LT BREAST BX W LOC DEV 1ST LESION IMAGE BX SPEC STEREO GUIDE 07/23/2023 [facility]   BREAST BIOPSY Left 07/23/2023   US  LT BREAST BX W LOC DEV 1ST LESION IMG BX SPEC US  GUIDE 07/23/2023 [facility]   CESAREAN SECTION     emergency   WISDOM TOOTH EXTRACTION      Prior to Admission medications   Medication Sig Start Date End Date Taking? Authorizing Provider  albuterol  (PROVENTIL  HFA;VENTOLIN  HFA) 108 (90 BASE) MCG/ACT inhaler Inhale 2 puffs into the lungs every 4 (four) hours as needed for wheezing or shortness of breath. Patient not taking: Reported on 11/02/2023 05/01/15   Liu, Dana Duo, MD  azithromycin  (ZITHROMAX ) 250 MG tablet Take 1 tablet (250 mg total) by mouth daily. Take first 2 tablets together, then 1 every day until finished. Please take if symptoms not improving after two weeks. Patient not taking: Reported on 11/02/2023 05/01/15   Liu, Dana Duo, MD  b complex vitamins capsule Take 1 capsule by mouth daily.    [provider]  FENUGREEK PO Take 1 tablet by mouth 2 (two) times daily. Patient not taking: Reported on 11/02/2023    [provider]  hydrOXYzine  (ATARAX /VISTARIL ) 25 MG tablet Take 1 tablet (25 mg total) by mouth every 6  (six) hours as needed for anxiety. Patient not taking: Reported on 11/02/2023 04/09/20   Mardi Shaker T, PA-C  ibuprofen  (ADVIL ,MOTRIN ) 200 MG tablet Take 400 mg by mouth every 6 (six) hours as needed for moderate pain. Patient not taking: Reported on 11/02/2023    [provider]  omega-3 acid ethyl esters (LOVAZA) 1 G capsule Take 1 g by mouth 2 (two) times daily. Patient not taking: Reported on 11/02/2023    [provider]  Prenatal Vit-Fe Fumarate-FA (PRENATAL MULTIVITAMIN) TABS tablet Take 1 tablet by mouth daily at 12 noon. Patient not taking: Reported on 11/02/2023    [provider]  Turmeric (QC TUMERIC COMPLEX PO) Take by mouth.    [provider]    Current Outpatient Medications  Medication Sig Dispense Refill   b complex vitamins capsule Take 1 capsule by mouth daily.     ibuprofen  (ADVIL ,MOTRIN ) 200 MG tablet Take 400 mg by mouth every 6 (six) hours as needed for moderate pain (pain score 4-6).     Turmeric (QC TUMERIC COMPLEX PO) Take by mouth.     Current Facility-Administered Medications  Medication Dose Route Frequency Provider Last Rate Last Admin   0.9 %  sodium chloride infusion  500 mL Intravenous Once Jimeka Balan M, MD        Allergies as of 11/19/2023   (No Known Allergies)  Family History  Problem Relation Age of Onset   Hypertension Mother    Diabetes Mother    Hyperlipidemia Mother    Heart attack Father    Cancer Maternal Grandmother        lung   Cancer Maternal Grandfather        unknown type   Cancer Paternal Grandfather        lung   Other Child        trisomy 37, deceased   Colon cancer Neg Hx    Colon polyps Neg Hx     Social History   Socioeconomic History   Marital status: Married    Spouse name: Not on file   Number of children: Not on file   Years of education: Not on file   Highest education level: Not on file  Occupational History   Not on file  Tobacco Use   Smoking status: Former     Current packs/day: 0.00    Types: Cigarettes    Quit date: 07/14/2006    Years since quitting: 17.3   Smokeless tobacco: Never  Vaping Use   Vaping status: Never Used  Substance and Sexual Activity   Alcohol use: Yes    Comment: very little, not while preg   Drug use: No   Sexual activity: Yes    Partners: Male    Birth control/protection: Post-menopausal  Other Topics Concern   Not on file  Social History Narrative   Not on file   Social Drivers of Health   Financial Resource Strain: Not on file  Food Insecurity: Not on file  Transportation Needs: Not on file  Physical Activity: Not on file  Stress: Not on file  Social Connections: Not on file  Intimate Partner Violence: Not on file    Review of Systems:  All other review of systems negative except as mentioned in the HPI.  Physical Exam: Vital signs BP 127/71   Pulse 62   Temp 98.4 F (36.9 C) (Temporal)   Resp 10   Ht 5' 5.5" (1.664 m)   Wt 160 lb (72.6 kg)   LMP  (LMP Unknown)   SpO2 100%   BMI 26.22 kg/m   General:   Alert,  Well-developed, well-nourished, pleasant and cooperative in NAD Airway:  Mallampati 2 Lungs:  Clear throughout to auscultation.   Heart:  Regular rate and rhythm; no murmurs, clicks, rubs,  or gallops. Abdomen:  Soft, nontender and nondistended. Normal bowel sounds.   Neuro/Psych:  Normal mood and affect. A and O x 3  Amber Hess, MD [facility]

## 2023-11-19 ENCOUNTER — Ambulatory Visit (AMBULATORY_SURGERY_CENTER): Admitting: Pediatrics

## 2023-11-19 ENCOUNTER — Encounter: Payer: Self-pay | Admitting: Pediatrics

## 2023-11-19 VITALS — BP 110/67 | HR 68 | Temp 98.4°F | Resp 15 | Ht 65.5 in | Wt 160.0 lb

## 2023-11-19 DIAGNOSIS — K5730 Diverticulosis of large intestine without perforation or abscess without bleeding: Secondary | ICD-10-CM | POA: Diagnosis not present

## 2023-11-19 DIAGNOSIS — Z1211 Encounter for screening for malignant neoplasm of colon: Secondary | ICD-10-CM

## 2023-11-19 DIAGNOSIS — D128 Benign neoplasm of rectum: Secondary | ICD-10-CM

## 2023-11-19 DIAGNOSIS — K621 Rectal polyp: Secondary | ICD-10-CM

## 2023-11-19 DIAGNOSIS — K648 Other hemorrhoids: Secondary | ICD-10-CM | POA: Diagnosis not present

## 2023-11-19 MED ORDER — SODIUM CHLORIDE 0.9 % IV SOLN: 500.0000 mL | Freq: Once | INTRAVENOUS | Status: DC

## 2023-11-19 NOTE — Op Note (Addendum)
 [facility] Patient Name: Amber Powell Procedure Date: 11/19/2023 9:08 AM MRN: [id number] Endoscopist: Eugenia Hess , MD, [id number] Age: 48 Referring MD:  Date of Birth: Jun 06, 1976 Gender: Female Account #: [id number] Procedure:                Colonoscopy Indications:              Screening for colorectal malignant neoplasm, This                            is the patient's first colonoscopy Medicines:                Monitored Anesthesia Care Procedure:                Pre-Anesthesia Assessment:                           - Prior to the procedure, a History and Physical                            was performed, and patient medications and                            allergies were reviewed. The patient's tolerance of                            previous anesthesia was also reviewed. The risks                            and benefits of the procedure and the sedation                            options and risks were discussed with the patient.                            All questions were answered, and informed consent                            was obtained. Prior Anticoagulants: The patient has                            taken no anticoagulant or antiplatelet agents. ASA                            Grade Assessment: II - A patient with mild systemic                            disease. After reviewing the risks and benefits,                            the patient was deemed in satisfactory condition to                            undergo the procedure.  After obtaining informed consent, the colonoscope                            was passed under direct vision. Throughout the                            procedure, the patient's blood pressure, pulse, and                            oxygen saturations were monitored continuously. The                            PCF-HQ190L Pediatric Colonoscope [id number] was                            introduced through the anus  and advanced to the                            cecum, identified by appendiceal orifice and                            ileocecal valve. The colonoscopy was performed                            without difficulty. The patient tolerated the                            procedure well. The quality of the bowel                            preparation was good. The terminal ileum, ileocecal                            valve, appendiceal orifice, and rectum were                            photographed. The CF HQ190L #[id number] was introduced                            through the and advanced to the. Scope In: 9:12:34 AM Scope Out: 9:24:25 AM Scope Withdrawal Time: 0 hours 9 minutes 56 seconds  Total Procedure Duration: 0 hours 11 minutes 51 seconds  Findings:                 The perianal and digital rectal examinations were                            normal. Pertinent negatives include normal                            sphincter tone and no palpable rectal lesions.                           Multiple medium-mouthed and small-mouthed  diverticula were found in the sigmoid colon,                            descending colon, transverse colon and ascending                            colon.                           Two sessile polyps were found in the rectum. The                            polyps were 2 to 3 mm in size. These polyps were                            removed with a cold biopsy forceps. Resection and                            retrieval were complete.                           Internal hemorrhoids were found during retroflexion. Complications:            No immediate complications. Estimated blood loss:                            Minimal. Estimated Blood Loss:     Estimated blood loss was minimal. Impression:               - Diverticulosis in the sigmoid colon, in the                            descending colon, in the transverse colon and in                             the ascending colon.                           - Two 2 to 3 mm polyps in the rectum, removed with                            a cold biopsy forceps. Resected and retrieved.                           - Internal hemorrhoids. Recommendation:           - Discharge patient to home (ambulatory).                           - Await pathology results.                           - Repeat colonoscopy for surveillance based on                            pathology results.                           -  The findings and recommendations were discussed                            with the patient's family.                           - Return to referring physician.                           - Patient has a contact number available for                            emergencies. The signs and symptoms of potential                            delayed complications were discussed with the                            patient. Return to normal activities tomorrow.                            Written discharge instructions were provided to the                            patient. Eugenia Hess, MD 11/19/2023 9:28:11 AM This report has been signed electronically. Addendum Number: 1   Addendum Date: 11/19/2023 10:31:22 AM      The terminal ileum was normal. Eugenia Hess, MD 11/19/2023 10:31:36 AM This report has been signed electronically.

## 2023-11-19 NOTE — Progress Notes (Signed)
 Called to room to assist during endoscopic procedure.  Patient ID and intended procedure confirmed with present staff. Received instructions for my participation in the procedure from the performing physician.

## 2023-11-19 NOTE — Progress Notes (Signed)
 Pt's states no medical or surgical changes since previsit or office visit.

## 2023-11-19 NOTE — Progress Notes (Signed)
 Report to PACU, RN, vss, BBS= Clear.

## 2023-11-19 NOTE — Patient Instructions (Signed)
 Resume previous diet and medications. Awaiting pathology results. Repeat Colonoscopy date to be determined based on pathology results. Handouts provided on Colon polyps, Diverticulosis and Hemorrhoids   YOU HAD AN ENDOSCOPIC PROCEDURE TODAY AT THE [facility]:   Refer to the procedure report that was given to you for any specific questions about what was found during the examination.  If the procedure report does not answer your questions, please call your gastroenterologist to clarify.  If you requested that your care partner not be given the details of your procedure findings, then the procedure report has been included in a sealed envelope for you to review at your convenience later.  YOU SHOULD EXPECT: Some feelings of bloating in the abdomen. Passage of more gas than usual.  Walking can help get rid of the air that was put into your GI tract during the procedure and reduce the bloating. If you had a lower endoscopy (such as a colonoscopy or flexible sigmoidoscopy) you may notice spotting of blood in your stool or on the toilet paper. If you underwent a bowel prep for your procedure, you may not have a normal bowel movement for a few days.  Please Note:  You might notice some irritation and congestion in your nose or some drainage.  This is from the oxygen used during your procedure.  There is no need for concern and it should clear up in a day or so.  SYMPTOMS TO REPORT IMMEDIATELY:  Following lower endoscopy (colonoscopy or flexible sigmoidoscopy):  Excessive amounts of blood in the stool  Significant tenderness or worsening of abdominal pains  Swelling of the abdomen that is new, acute  Fever of 100F or higher  For urgent or emergent issues, a gastroenterologist can be reached at any hour by calling [phone]. Do not use MyChart messaging for urgent concerns.    DIET:  We do recommend a small meal at first, but then you may proceed to your regular diet.  Drink plenty of  fluids but you should avoid alcoholic beverages for 24 hours.  ACTIVITY:  You should plan to take it easy for the rest of today and you should NOT DRIVE or use heavy machinery until tomorrow (because of the sedation medicines used during the test).    FOLLOW UP: Our staff will call the number listed on your records the next business day following your procedure.  We will call around 7:15- 8:00 am to check on you and address any questions or concerns that you may have regarding the information given to you following your procedure. If we do not reach you, we will leave a message.     If any biopsies were taken you will be contacted by phone or by letter within the next 1-3 weeks.  Please call us at [phone] if you have not heard about the biopsies in 3 weeks.    SIGNATURES/CONFIDENTIALITY: You and/or your care partner have signed paperwork which will be entered into your electronic medical record.  These signatures attest to the fact that that the information above on your After Visit Summary has been reviewed and is understood.  Full responsibility of the confidentiality of this discharge information lies with you and/or your care-partner.

## 2023-11-20 ENCOUNTER — Telehealth: Payer: Self-pay

## 2023-11-20 NOTE — Telephone Encounter (Signed)
  Follow up Call-     11/19/2023    8:35 AM  Call back number  Post procedure Call Back phone  # [phone]  Permission to leave phone message Yes     Patient questions:  Do you have a fever, pain , or abdominal swelling? No. Pain Score  0 *  Have you tolerated food without any problems? Yes.    Have you been able to return to your normal activities? Yes.    Do you have any questions about your discharge instructions: Diet   No. Medications  No. Follow up visit  No.  Do you have questions or concerns about your Care? No.  Actions: * If pain score is 4 or above: No action needed, pain <4.

## 2023-11-23 ENCOUNTER — Encounter: Payer: Self-pay | Admitting: Pediatrics

## 2023-11-23 LAB — SURGICAL PATHOLOGY

## 2024-01-05 ENCOUNTER — Other Ambulatory Visit: Payer: Self-pay | Admitting: Obstetrics & Gynecology

## 2024-01-05 DIAGNOSIS — N6012 Diffuse cystic mastopathy of left breast: Secondary | ICD-10-CM

## 2024-01-12 DIAGNOSIS — E782 Mixed hyperlipidemia: Secondary | ICD-10-CM | POA: Diagnosis not present

## 2024-01-19 ENCOUNTER — Ambulatory Visit
Admission: RE | Admit: 2024-01-19 | Discharge: 2024-01-19 | Disposition: A | Discharge status: Home / Self Care | Source: Ambulatory Visit | Attending: Obstetrics & Gynecology | Admitting: Obstetrics & Gynecology

## 2024-01-19 ENCOUNTER — Other Ambulatory Visit: Payer: Self-pay | Admitting: Obstetrics & Gynecology

## 2024-01-19 DIAGNOSIS — D242 Benign neoplasm of left breast: Secondary | ICD-10-CM | POA: Diagnosis not present

## 2024-01-19 DIAGNOSIS — N6320 Unspecified lump in the left breast, unspecified quadrant: Secondary | ICD-10-CM

## 2024-01-19 DIAGNOSIS — N6012 Diffuse cystic mastopathy of left breast: Secondary | ICD-10-CM

## 2024-07-12 ENCOUNTER — Ambulatory Visit: Admission: RE | Admit: 2024-07-12 | Source: Ambulatory Visit

## 2024-07-12 ENCOUNTER — Ambulatory Visit
Admission: RE | Admit: 2024-07-12 | Discharge: 2024-07-12 | Disposition: A | Discharge status: Home / Self Care | Source: Ambulatory Visit | Attending: Obstetrics & Gynecology | Admitting: Obstetrics & Gynecology

## 2024-07-12 DIAGNOSIS — N6320 Unspecified lump in the left breast, unspecified quadrant: Secondary | ICD-10-CM
